# Patient Record
Sex: Male | Born: 1990
Health system: Southern US, Community
[De-identification: ages and names within clinical notes are randomized; demographics above are authoritative.]

## PROBLEM LIST (undated history)

## (undated) DIAGNOSIS — G43909 Migraine, unspecified, not intractable, without status migrainosus: Secondary | ICD-10-CM

## (undated) HISTORY — DX: Migraine, unspecified, not intractable, without status migrainosus: G43.909

---
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
## 2012-01-21 ENCOUNTER — Encounter: Payer: Self-pay | Admitting: Gastroenterology

## 2012-01-21 ENCOUNTER — Other Ambulatory Visit (INDEPENDENT_AMBULATORY_CARE_PROVIDER_SITE_OTHER): Payer: BC Managed Care – PPO

## 2012-01-21 ENCOUNTER — Ambulatory Visit (INDEPENDENT_AMBULATORY_CARE_PROVIDER_SITE_OTHER): Payer: BC Managed Care – PPO | Admitting: Gastroenterology

## 2012-01-21 VITALS — BP 112/60 | HR 72 | Ht 71.0 in | Wt 255.1 lb

## 2012-01-21 DIAGNOSIS — R197 Diarrhea, unspecified: Secondary | ICD-10-CM

## 2012-01-21 DIAGNOSIS — L259 Unspecified contact dermatitis, unspecified cause: Secondary | ICD-10-CM

## 2012-01-21 DIAGNOSIS — Z91018 Allergy to other foods: Secondary | ICD-10-CM

## 2012-01-21 DIAGNOSIS — L309 Dermatitis, unspecified: Secondary | ICD-10-CM

## 2012-01-21 DIAGNOSIS — T7840XA Allergy, unspecified, initial encounter: Secondary | ICD-10-CM

## 2012-01-21 LAB — CBC WITH DIFFERENTIAL/PLATELET
Basophils Relative: 0.2 % (ref 0.0–3.0)
Eosinophils Absolute: 0.2 10*3/uL (ref 0.0–0.7)
Eosinophils Relative: 3.2 % (ref 0.0–5.0)
Hemoglobin: 16.6 g/dL (ref 13.0–17.0)
Lymphocytes Relative: 28.5 % (ref 12.0–46.0)
MCHC: 34.4 g/dL (ref 30.0–36.0)
MCV: 86.7 fl (ref 78.0–100.0)
Neutro Abs: 4.8 10*3/uL (ref 1.4–7.7)
Neutrophils Relative %: 62.9 % (ref 43.0–77.0)
RBC: 5.57 Mil/uL (ref 4.22–5.81)
WBC: 7.6 10*3/uL (ref 4.5–10.5)

## 2012-01-21 LAB — COMPREHENSIVE METABOLIC PANEL
AST: 28 U/L (ref 0–37)
Albumin: 4.4 g/dL (ref 3.5–5.2)
Alkaline Phosphatase: 55 U/L (ref 39–117)
BUN: 12 mg/dL (ref 6–23)
Calcium: 9.6 mg/dL (ref 8.4–10.5)
Creatinine, Ser: 0.9 mg/dL (ref 0.4–1.5)
Glucose, Bld: 91 mg/dL (ref 70–99)

## 2012-01-21 LAB — C-REACTIVE PROTEIN: CRP: 1 mg/dL (ref 1–20)

## 2012-01-21 LAB — VITAMIN B12: Vitamin B-12: 482 pg/mL (ref 211–911)

## 2012-01-21 LAB — IBC PANEL
Iron: 66 ug/dL (ref 42–165)
Saturation Ratios: 14.3 % — ABNORMAL LOW (ref 20.0–50.0)
Transferrin: 329 mg/dL (ref 212.0–360.0)

## 2012-01-21 LAB — FOLATE: Folate: 17.5 ng/mL (ref >5.9)

## 2012-01-21 LAB — SEDIMENTATION RATE: Sed Rate: 4 mm/hr (ref 0–22)

## 2012-01-21 LAB — FERRITIN: Ferritin: 55.5 ng/mL (ref 22.0–322.0)

## 2012-01-21 MED ORDER — DICYCLOMINE HCL 10 MG PO CAPS: 10.0000 mg | ORAL_CAPSULE | Freq: Three times a day (TID) | ORAL | Status: DC

## 2012-01-21 NOTE — Patient Instructions (Addendum)
Your physician has requested that you go to the basement for the following lab work before leaving today: Stool for O&P, Stool for WBC, Fecal Fat, [facility] Panel, Anemia Profile, CRP, Sed rate, Celiac 10 panel. We have sent the following medications to your pharmacy for you to pick up at your convenience: Bentyl 10 mg-Take 1 tablet 3 times daily before meals. We have given you a "FODMAP" diet to read over. CC: Dr Lynnea Ferrier

## 2012-01-21 NOTE — Progress Notes (Signed)
History of Present Illness:  This is a 21 year old Caucasian male [facility]. He is with his mother today, and they complain of 5 years of postprandial urgency, diarrhea, occasional crampy pain and occasional nausea. The patient does not have nocturnal diarrhea or incontinency, melena or hematochezia. He does have mild lactose intolerance, but otherwise does not have any specific food intolerances. His appetite is good and his weight is stable. He denies dyspepsia, reflux symptoms, history of hepatitis, pancreatitis, or any hepatobiliary or general medical symptoms. He does have recurrent eczema and a severe nut allergy. He has not had previous x-rays, endoscopic exams or stool exams. There is no history of infectious disease exposure, foreign travel, antibiotic use, or familial diseases. His mother is a patient mine has IBS diarrhea.  I have reviewed this patient's present history, medical and surgical past history, allergies and medications.     ROS: The remainder of the 10 point ROS is negative... history of recurrent migraine headaches, but the patient denies regular use of NSAIDs or salicylates. He denies any specific neurological or psychiatric problems.     Physical Exam: Blood pressure 112/60, pulse 72 and regular, and weight 255 pounds the BMI of 35.58. I cannot appreciate stigmata of chronic liver disease. General well developed well nourished patient in no acute distress, appearing their stated age Eyes PERRLA, no icterus, fundoscopic exam per opthamologist Skin no lesions noted Neck supple, no adenopathy, no thyroid enlargement, no tenderness Chest clear to percussion and auscultation Heart no significant murmurs, gallops or rubs noted Abdomen no hepatosplenomegaly masses or tenderness, BS normal.  Rectal inspection normal no fissures, or fistulae noted.  No masses or tenderness on digital exam. Stool guaiac negative. Extremities no acute joint lesions, edema,  phlebitis or evidence of cellulitis. Neurologic patient oriented x 3, cranial nerves intact, no focal neurologic deficits noted. Psychological mental status normal and normal affect.  Assessment and plan: Symptoms consistent with rapid intestinal transient IBS. I have ordered stool exams for parasites, WBC, culture, and blood tests including celiac profile. Stool [place] fat stain also ordered. I have placed him on a FODMAP-IBS diet with dicyclomine 10 mg 30 minutes before meals 3 times a day. The patient has not tried Imodium in the past. He may need endoscopy with small bowel biopsy and colonoscopy depending on his workup and clinical course. Encounter Diagnosis  Name Primary?  . Diarrhea Yes

## 2012-01-22 ENCOUNTER — Other Ambulatory Visit: Payer: BC Managed Care – PPO

## 2012-01-22 DIAGNOSIS — R197 Diarrhea, unspecified: Secondary | ICD-10-CM

## 2012-01-22 LAB — CELIAC PANEL 10: Tissue Transglutaminase Ab, IgA: 2.4 U/mL (ref <20)

## 2012-01-24 LAB — OVA AND PARASITE SCREEN

## 2012-01-28 ENCOUNTER — Telehealth: Payer: Self-pay | Admitting: Gastroenterology

## 2012-01-28 NOTE — Telephone Encounter (Signed)
I need to call [facility] about the fecal fat qual. Test that was cancelled; all other tests were normal except iron saturation. Mom reports pt started the dicyclomine and gets nauseated when he takes that. His stools may be loose, but not really watery like diarrhea. Please advise. Thanks.

## 2012-01-29 NOTE — Telephone Encounter (Signed)
He has ibs...use prn imodium and FODMAP DIET

## 2012-01-29 NOTE — Telephone Encounter (Signed)
Do I need to pursue w/ [facility] the cancelled fecal fat qual. Test ? Thanks.

## 2012-01-29 NOTE — Telephone Encounter (Signed)
NO

## 2012-01-29 NOTE — Telephone Encounter (Signed)
lmom for pt to call back

## 2012-01-30 NOTE — Telephone Encounter (Signed)
Informed mom of Dr Norval Gable dx of IBS and his recommendation to follow the FODMAP diet and use Imodium PRN. Mom and I had a long conversation about the labs, OV, and pt's problems. I offered to ask Dr Jarold Motto if pt could be schedule for an ECL per his note, but mom declined. She states he already eats basically what's on the diet. I offered pt another appt to discuss this with Dr Jarold Motto and she declined stating she's just frustrated. Informed mom she may call back for questions and she stated understanding.

## 2012-11-27 ENCOUNTER — Encounter: Payer: Self-pay | Admitting: Family Medicine

## 2012-11-27 ENCOUNTER — Ambulatory Visit (INDEPENDENT_AMBULATORY_CARE_PROVIDER_SITE_OTHER): Payer: BC Managed Care – PPO | Admitting: Family Medicine

## 2012-11-27 VITALS — BP 110/72 | HR 78 | Temp 98.7°F | Resp 16 | Wt 244.0 lb

## 2012-11-27 DIAGNOSIS — G43909 Migraine, unspecified, not intractable, without status migrainosus: Secondary | ICD-10-CM

## 2012-11-27 MED ORDER — TOPIRAMATE 25 MG PO TABS: 50.0000 mg | ORAL_TABLET | Freq: Two times a day (BID) | ORAL | Status: DC

## 2012-11-27 NOTE — Progress Notes (Signed)
  Subjective:    Patient ID: Paul Guzman, male    DOB: [birth year], 22 y.o.   MRN: [id number]  HPI  Patient was started on Topamax 50 mg by mouth twice a day for migraine prophylaxis in December of 2013.  At that time he was having a daily headache and 3 for severe migraines per month.  The Topamax he no longer has a daily headache. He only has one migraine per month. He is doing well. He has lost almost 10 pounds since starting the Topamax.   He is still interested in possibly trying to wean down on the Topamax. Past Medical History  Diagnosis Date  . Migraines    No current outpatient prescriptions on file prior to visit.   No current facility-administered medications on file prior to visit.   No Known Allergies History   Social History  . Marital Status: Single    Spouse Name: N/A    Number of Children: 0  . Years of Education: N/A   Occupational History  . student    Social History Main Topics  . Smoking status: Never Smoker   . Smokeless tobacco: Never Used  . Alcohol Use: No  . Drug Use: No  . Sexually Active: Not on file   Other Topics Concern  . Not on file   Social History Narrative  . No narrative on file     Review of Systems  All other systems reviewed and are negative.       Objective:   Physical Exam  Vitals reviewed. Eyes: Conjunctivae are normal. Pupils are equal, round, and reactive to light.  Neck: Neck supple. No thyromegaly present.  Cardiovascular: Normal rate, regular rhythm and normal heart sounds.   Pulmonary/Chest: Effort normal and breath sounds normal. No respiratory distress. He has no wheezes. He has no rales.  Abdominal: Soft. Bowel sounds are normal. He exhibits no distension. There is no tenderness. There is no rebound.  Lymphadenopathy:    He has no cervical adenopathy.          Assessment & Plan:  1. Migraines Begin slowly weaning down on Topamax. Decrease the dose by 25 mg each month until either the patient is off  Topamax or  Until we reach the lowest effective dose. - COMPLETE METABOLIC PANEL WITH GFR - CBC with Differential

## 2012-11-28 ENCOUNTER — Encounter: Payer: Self-pay | Admitting: Family Medicine

## 2012-11-28 LAB — COMPLETE METABOLIC PANEL WITH GFR
AST: 25 U/L (ref 0–37)
Albumin: 4.8 g/dL (ref 3.5–5.2)
BUN: 13 mg/dL (ref 6–23)
CO2: 29 mEq/L (ref 19–32)
Calcium: 9.8 mg/dL (ref 8.4–10.5)
Chloride: 105 mEq/L (ref 96–112)
GFR, Est African American: >89 mL/min
Glucose, Bld: 93 mg/dL (ref 70–99)
Potassium: 4 mEq/L (ref 3.5–5.3)

## 2012-11-28 LAB — CBC WITH DIFFERENTIAL/PLATELET
Basophils Absolute: 0 10*3/uL (ref 0.0–0.1)
Eosinophils Absolute: 0.2 10*3/uL (ref 0.0–0.7)
Eosinophils Relative: 2 % (ref 0–5)
Lymphocytes Relative: 29 % (ref 12–46)
MCV: 82.7 fL (ref 78.0–100.0)
Neutrophils Relative %: 63 % (ref 43–77)
Platelets: 283 10*3/uL (ref 150–400)
RDW: 13.7 % (ref 11.5–15.5)
WBC: 8 10*3/uL (ref 4.0–10.5)

## 2012-12-23 ENCOUNTER — Other Ambulatory Visit: Payer: Self-pay | Admitting: Family Medicine

## 2012-12-24 ENCOUNTER — Telehealth: Payer: Self-pay | Admitting: Family Medicine

## 2012-12-24 MED ORDER — EPINEPHRINE 0.3 MG/0.3ML IJ SOAJ: 0.3000 mg | Freq: Once | INTRAMUSCULAR | Status: DC

## 2012-12-24 NOTE — Telephone Encounter (Signed)
Med ordered

## 2013-04-15 ENCOUNTER — Telehealth: Payer: Self-pay | Admitting: Family Medicine

## 2013-04-15 NOTE — Telephone Encounter (Signed)
Patient would like to have Dr.Pickard . Change his RX of Topamax back to 50 mg in the morning and 50 mg at night. [facility] [address] .

## 2013-04-16 ENCOUNTER — Other Ambulatory Visit: Payer: Self-pay | Admitting: Family Medicine

## 2013-04-16 MED ORDER — TOPIRAMATE 25 MG PO TABS: 50.0000 mg | ORAL_TABLET | Freq: Two times a day (BID) | ORAL | Status: DC

## 2013-04-16 NOTE — Telephone Encounter (Signed)
done

## 2013-06-19 ENCOUNTER — Other Ambulatory Visit: Payer: Self-pay | Admitting: Family Medicine

## 2013-06-19 NOTE — Telephone Encounter (Signed)
Ok to refill 

## 2013-06-22 NOTE — Telephone Encounter (Signed)
ok 

## 2013-10-29 ENCOUNTER — Ambulatory Visit (INDEPENDENT_AMBULATORY_CARE_PROVIDER_SITE_OTHER): Payer: BC Managed Care – PPO | Admitting: Family Medicine

## 2013-10-29 ENCOUNTER — Encounter: Payer: Self-pay | Admitting: Family Medicine

## 2013-10-29 VITALS — BP 122/74 | HR 100 | Temp 98.3°F | Resp 18 | Ht 70.5 in | Wt 258.0 lb

## 2013-10-29 DIAGNOSIS — J019 Acute sinusitis, unspecified: Secondary | ICD-10-CM

## 2013-10-29 MED ORDER — AMOXICILLIN 875 MG PO TABS: 875.0000 mg | ORAL_TABLET | Freq: Two times a day (BID) | ORAL | Status: DC

## 2013-10-29 NOTE — Progress Notes (Signed)
   Subjective:    Patient ID: Paul Guzman, male    DOB: 1990-10-30, 23 y.o.   MRN: [id number]  HPI Patient has been dealing with allergies for the last week. He's been taking Claritin to help treat the postnasal drip, drainage, rhinorrhea. However recently he developed pain in his left maxillary sinus area and a severe sore throat due to worsening drainage. He's also developed a fever to 102 and had. On examination today there is no erythema in the posterior oropharynx however the patient is extremely tender to palpation in his left maxillary sinus area. Past Medical History  Diagnosis Date  . Migraines    Current Outpatient Prescriptions on File Prior to Visit  Medication Sig Dispense Refill  . EPINEPHrine (AUVI-Q) 0.3 mg/0.3 mL SOAJ Inject 0.3 mLs (0.3 mg total) into the muscle once.  1 Device  1  . EPIPEN 2-PAK 0.3 MG/0.3ML SOAJ USE AS DIRECTED  1 Device  1  . loratadine-pseudoephedrine (CLARITIN-D 24-HOUR) 10-240 MG per 24 hr tablet Take 1 tablet by mouth daily.      . rizatriptan (MAXALT) 10 MG tablet TAKE 1 TABLET BY MOUTH AS NEEDED FOR MIGRANE  6 tablet  3  . topiramate (TOPAMAX) 25 MG tablet Take 2 tablets (50 mg total) by mouth 2 (two) times daily.  120 tablet  3   No current facility-administered medications on file prior to visit.   No Known Allergies History   Social History  . Marital Status: Single    Spouse Name: N/A    Number of Children: 0  . Years of Education: N/A   Occupational History  . student    Social History Main Topics  . Smoking status: Never Smoker   . Smokeless tobacco: Never Used  . Alcohol Use: No  . Drug Use: No  . Sexual Activity: Not on file   Other Topics Concern  . Not on file   Social History Narrative  . No narrative on file      Review of Systems  All other systems reviewed and are negative.      Objective:   Physical Exam  Vitals reviewed. Constitutional: He appears well-developed and well-nourished.  HENT:  Right  Ear: Tympanic membrane, external ear and ear canal normal.  Left Ear: Tympanic membrane, external ear and ear canal normal.  Nose: Mucosal edema and rhinorrhea present. Left sinus exhibits maxillary sinus tenderness.  Mouth/Throat: Oropharynx is clear and moist. No oropharyngeal exudate.  Eyes: Conjunctivae are normal.  Neck: Normal range of motion. Neck supple.  Cardiovascular: Normal rate, regular rhythm and normal heart sounds.   Pulmonary/Chest: Effort normal and breath sounds normal. No respiratory distress. He has no wheezes. He has no rales.  Lymphadenopathy:    He has no cervical adenopathy.          Assessment & Plan:  1. Acute rhinosinusitis I believe the patient is dealing with seasonal allergies and has subsequently developed a secondary bacterial sinusitis. Continue Claritin. Add nasal saline 4 times a day, and amoxicillin 875 mg by mouth twice a day for 10 days - amoxicillin (AMOXIL) 875 MG tablet; Take 1 tablet (875 mg total) by mouth 2 (two) times daily.  Dispense: 20 tablet; Refill: 0

## 2013-12-23 ENCOUNTER — Ambulatory Visit (INDEPENDENT_AMBULATORY_CARE_PROVIDER_SITE_OTHER): Payer: BC Managed Care – PPO | Admitting: Family Medicine

## 2013-12-23 ENCOUNTER — Encounter: Payer: Self-pay | Admitting: Family Medicine

## 2013-12-23 VITALS — BP 120/76 | HR 78 | Temp 97.9°F | Resp 16 | Ht 68.0 in | Wt 258.0 lb

## 2013-12-23 DIAGNOSIS — J0101 Acute recurrent maxillary sinusitis: Secondary | ICD-10-CM

## 2013-12-23 DIAGNOSIS — J0100 Acute maxillary sinusitis, unspecified: Secondary | ICD-10-CM

## 2013-12-23 DIAGNOSIS — Z9109 Other allergy status, other than to drugs and biological substances: Secondary | ICD-10-CM

## 2013-12-23 MED ORDER — METHYLPREDNISOLONE (PAK) 4 MG PO TABS: ORAL_TABLET | ORAL | Status: DC

## 2013-12-23 MED ORDER — AMOXICILLIN-POT CLAVULANATE 875-125 MG PO TABS: 1.0000 | ORAL_TABLET | Freq: Two times a day (BID) | ORAL | Status: DC

## 2013-12-23 NOTE — Patient Instructions (Signed)
Start antibiotics Steroids for inflammation Call if not improved

## 2013-12-23 NOTE — Progress Notes (Signed)
Patient ID: Paul GrieveJustin L Guzman, male   DOB: 10-09-1990, 23 y.o.   MRN: [id number]   Subjective:    Patient ID: Paul Guzman, male    DOB: 10-09-1990, 23 y.o.   MRN: [id number]  Patient presents for Illness  patient here with sinus pressure drainage minimal cough, dizzy spells headache for the past 4 days. He has history of allergies and daily congestion. He's had problems with reactive airway and upper respiratory throughout his childhood. His mother also suffers with sinus disease. He was treated for sinus infection back in May with amoxicillin he did also using nasal steroids which he does use on and off but does not get very much benefit from these. He did clear up after that visit but his symptoms have returned. He also had subjective fever and chills this weekend. He does note that the sinus pressure and headache itself as migraine again but this resolved with the use of his medication    Review Of Systems:  GEN- denies fatigue, fever, weight loss,weakness, recent illness HEENT- denies eye drainage, change in vision, nasal discharge, CVS- denies chest pain, palpitations RESP- denies SOB, cough, wheeze ABD- denies N/V, change in stools, abd pain Neuro- denies headache, dizziness, syncope, seizure activity       Objective:    BP 120/76  Pulse 78  Temp(Src) 97.9 F (36.6 C) (Oral)  Resp 16  Ht 5\' 8"  (1.727 m)  Wt 258 lb (117.028 kg)  BMI 39.24 kg/m2 GEN- NAD, alert and oriented x3 HEENT- PERRL, EOMI, non injected sclera, pink conjunctiva, MMM, oropharynx clear TM clear bilat no effusion, + maxillary sinus tenderness, inflammed turbinates,  Nasal drainage  Neck- Supple, no LAD CVS- RRR, no murmur RESP-CTAB Pulses- Radial 2+        Assessment & Plan:      Problem List Items Addressed This Visit   None    Visit Diagnoses   Acute recurrent maxillary sinusitis    -  Primary    He has daily congestion and nasal symptoms. He may benefit from ear nose and throat he wants to  hold off at this time and treat. I will put him on a Medrol Dose and Augmentin , 2nd illness in past 2 months and based on history    Relevant Medications       AMOXICILLIN-POT CLAVULANATE 875-125 MG PO TABS       Methylprednisolone (MEDROL PAK) 4 mg po tab    Environmental allergies        He's had allergy testing he has known environmental allergies especially to tree nuts which he has an EpiPen. He may benefit from ear nose and throat        Note: This dictation was prepared with Dragon dictation along with smaller phrase technology. Any transcriptional errors that result from this process are unintentional.

## 2013-12-25 ENCOUNTER — Other Ambulatory Visit: Payer: Self-pay | Admitting: *Deleted

## 2013-12-25 ENCOUNTER — Other Ambulatory Visit: Payer: Self-pay | Admitting: Family Medicine

## 2013-12-25 MED ORDER — TOPIRAMATE 25 MG PO TABS: 50.0000 mg | ORAL_TABLET | Freq: Two times a day (BID) | ORAL | Status: DC

## 2013-12-25 NOTE — Telephone Encounter (Signed)
Rx Refilled  

## 2013-12-25 NOTE — Telephone Encounter (Signed)
Refill appropriate and filled per protocol. 

## 2014-01-25 ENCOUNTER — Other Ambulatory Visit: Payer: Self-pay | Admitting: Family Medicine

## 2014-01-26 ENCOUNTER — Telehealth: Payer: Self-pay | Admitting: *Deleted

## 2014-01-26 NOTE — Telephone Encounter (Signed)
Received fax requesting PA on Auvi- Q.   PA submitted.

## 2014-01-28 ENCOUNTER — Other Ambulatory Visit: Payer: Self-pay | Admitting: *Deleted

## 2014-02-11 NOTE — Telephone Encounter (Signed)
Received PA determination.   PA approved 01/11/2014- 02/10/2015.  Case ID: [id number].

## 2014-04-15 ENCOUNTER — Telehealth: Payer: Self-pay | Admitting: Family Medicine

## 2014-04-15 MED ORDER — EPINEPHRINE 0.3 MG/0.3ML IJ SOAJ: 0.3000 mg | Freq: Once | INTRAMUSCULAR | Status: DC

## 2014-04-15 NOTE — Telephone Encounter (Signed)
Mom is calling to say that there is a recall at the time of the auvi q and would like to know until the recall clears up can we call him in an epipen  [phone]

## 2014-04-15 NOTE — Telephone Encounter (Signed)
Med sent to pharm and pt aware 

## 2014-07-17 ENCOUNTER — Other Ambulatory Visit: Payer: Self-pay | Admitting: Family Medicine

## 2015-12-04 ENCOUNTER — Other Ambulatory Visit: Payer: Self-pay | Admitting: Family Medicine

## 2015-12-06 NOTE — Telephone Encounter (Signed)
Refill appropriate and filled per protocol. 

## 2016-01-10 ENCOUNTER — Telehealth: Payer: Self-pay | Admitting: Family Medicine

## 2016-01-10 MED ORDER — EPINEPHRINE 0.3 MG/0.3ML IJ SOAJ: 0.3000 mg | Freq: Once | INTRAMUSCULAR | 1 refills | Status: DC

## 2016-01-10 NOTE — Telephone Encounter (Signed)
Patient would like a new [facility] [address] in [place] for his EPINEPHrine (EPIPEN 2-PAK) 0.3 mg/0.3 mL IJ SOAJ injection  He recently moved to [place].   CB# [phone]

## 2016-01-10 NOTE — Telephone Encounter (Signed)
Medication called/sent to requested pharmacy  

## 2016-07-06 ENCOUNTER — Encounter: Payer: Self-pay | Admitting: Family Medicine

## 2016-07-06 ENCOUNTER — Telehealth: Payer: Self-pay | Admitting: Family Medicine

## 2016-07-06 ENCOUNTER — Ambulatory Visit (INDEPENDENT_AMBULATORY_CARE_PROVIDER_SITE_OTHER): Payer: 59 | Admitting: Family Medicine

## 2016-07-06 VITALS — BP 118/70 | HR 92 | Temp 99.0°F | Resp 18 | Ht 68.0 in | Wt 279.0 lb

## 2016-07-06 DIAGNOSIS — J069 Acute upper respiratory infection, unspecified: Secondary | ICD-10-CM

## 2016-07-06 DIAGNOSIS — J0100 Acute maxillary sinusitis, unspecified: Secondary | ICD-10-CM

## 2016-07-06 MED ORDER — GUAIFENESIN-CODEINE 100-10 MG/5ML PO SOLN: 5.0000 mL | Freq: Four times a day (QID) | ORAL | 0 refills | Status: DC | PRN

## 2016-07-06 MED ORDER — AMOXICILLIN-POT CLAVULANATE 875-125 MG PO TABS: 1.0000 | ORAL_TABLET | Freq: Two times a day (BID) | ORAL | 0 refills | Status: DC

## 2016-07-06 NOTE — Patient Instructions (Addendum)
F/U as needed  Give work note for Today, can return Monday

## 2016-07-06 NOTE — Telephone Encounter (Signed)
Medication called to pharmacy. 

## 2016-07-06 NOTE — Progress Notes (Signed)
   Subjective:    Patient ID: Paul GrieveJustin L Guzman, male    DOB: Jun 05, 1991, 26 y.o.   MRN: [id number]  Patient presents for Illness (x1 week- sinus pressure, fever, HA, chest congestion)    Pt here with sinus pressure, drainage, fever, headache congestion for past week. HIstory of sinus infections in past and multiple URI as child. Last seen here 2.5 years ago. He's not had any issues for the past 1-2 years he typically just takes his Claritin-D and tries to keep his sinuses flushed out. His symptoms however persisted despite taking over-the-counter cough and cold medications he also started taking Advil. His fever did not start until yesterday. He has not had any difficulty breathing. He did have some dizziness last night with the congestion and the fever    Review Of Systems:  GEN- denies fatigue,+ fever, weight loss,weakness, recent illness HEENT- denies eye drainage, change in vision, +nasal discharge, CVS- denies chest pain, palpitations RESP- denies SOB,+ cough, wheeze ABD- denies N/V, change in stools, abd pain GU- denies dysuria, hematuria, dribbling, incontinence MSK- denies joint pain, muscle aches, injury Neuro- + headache, +dizziness, denies syncope, seizure activity       Objective:    BP 118/70 (BP Location: Left Arm, Patient Position: Sitting, Cuff Size: Large)   Pulse 92   Temp 99 F (37.2 C) (Oral)   Resp 18   Ht 5\' 8"  (1.727 m)   Wt 279 lb (126.6 kg)   SpO2 97%   BMI 42.42 kg/m  GEN- NAD, alert and oriented x3 HEENT- PERRL, EOMI, non injected sclera, pink conjunctiva, MMM, oropharynx mild injection, TM clear bilat no effusion,  + maxillary sinus tenderness, inflammed turbinates,  Nasal drainage  Neck- Supple,+ LAD CVS- RRR, no murmur RESP-CTAB EXT- No edema Pulses- Radial 2+        Assessment & Plan:      Problem List Items Addressed This Visit    None    Visit Diagnoses    Acute non-recurrent maxillary sinusitis    -  Primary   sinusitis with some  phargynitis, post nasal causing the cough mostly. Treat with augmentin, robitussin AC, fever reducer, advised to use his claritin D    Relevant Medications   amoxicillin-clavulanate (AUGMENTIN) 875-125 MG tablet   guaiFENesin-codeine 100-10 MG/5ML syrup   Acute URI          Note: This dictation was prepared with Dragon dictation along with smaller phrase technology. Any transcriptional errors that result from this process are unintentional.

## 2016-07-06 NOTE — Telephone Encounter (Signed)
Pharmacy unable to fill cough syrup, it is on back order. Will need to be sent to [facility] on [address] [phone].

## 2017-04-01 DIAGNOSIS — Z23 Encounter for immunization: Secondary | ICD-10-CM | POA: Diagnosis not present

## 2017-04-28 ENCOUNTER — Other Ambulatory Visit: Payer: Self-pay | Admitting: Family Medicine

## 2017-05-03 ENCOUNTER — Other Ambulatory Visit: Payer: Self-pay | Admitting: Family Medicine

## 2017-05-03 MED ORDER — EPINEPHRINE 0.3 MG/0.3ML IJ SOAJ: 0.3000 mg | Freq: Once | INTRAMUSCULAR | 1 refills | Status: AC

## 2017-07-08 ENCOUNTER — Encounter: Payer: Self-pay | Admitting: Family Medicine

## 2017-07-08 ENCOUNTER — Ambulatory Visit (INDEPENDENT_AMBULATORY_CARE_PROVIDER_SITE_OTHER): Payer: 59 | Admitting: Family Medicine

## 2017-07-08 VITALS — BP 136/88 | HR 106 | Temp 99.0°F | Resp 14 | Ht 70.5 in | Wt 292.0 lb

## 2017-07-08 DIAGNOSIS — J324 Chronic pansinusitis: Secondary | ICD-10-CM

## 2017-07-08 MED ORDER — LEVOCETIRIZINE DIHYDROCHLORIDE 5 MG PO TABS: 5.0000 mg | ORAL_TABLET | Freq: Every evening | ORAL | 5 refills | Status: DC

## 2017-07-08 MED ORDER — TRIAMCINOLONE ACETONIDE 55 MCG/ACT NA AERO: 2.0000 | INHALATION_SPRAY | Freq: Every day | NASAL | 12 refills | Status: DC

## 2017-07-08 MED ORDER — AZELASTINE HCL 0.1 % NA SOLN: 2.0000 | Freq: Two times a day (BID) | NASAL | 12 refills | Status: DC

## 2017-07-08 NOTE — Progress Notes (Signed)
   Subjective:    Patient ID: Paul Guzman, male    DOB: Jan 09, 1991, 27 y.o.   MRN: [id number]  HPI She has been suffering with sinuses off and on his entire life. He states that for the last 2 years there has been constant pressure in both maxillary and both frontal sinuses. Over the last 1-1/2 months, the symptoms of gotten substantially worse or he has daily headaches. He is tried intermittently Claritin, Flonase, Afrin, and Sudafed with minimal relief. He is never able to fully clear his sinus passages. He feels that he is unable to breathe through his nostrils. He is here today to try to address this ongoing chronic problem. He denies any fevers or chills. He does have a history of migraines but he states that these pressure-like headaches are different and constant and mild but always present Past Medical History:  Diagnosis Date  . Migraines     Allergies  Allergen Reactions  . Other     Tree Nuts    Social History   Socioeconomic History  . Marital status: Single    Spouse name: Not on file  . Number of children: 0  . Years of education: Not on file  . Highest education level: Not on file  Social Needs  . Financial resource strain: Not on file  . Food insecurity - worry: Not on file  . Food insecurity - inability: Not on file  . Transportation needs - medical: Not on file  . Transportation needs - non-medical: Not on file  Occupational History  . Occupation: Consulting civil engineerstudent  Tobacco Use  . Smoking status: Never Smoker  . Smokeless tobacco: Never Used  Substance and Sexual Activity  . Alcohol use: No  . Drug use: No  . Sexual activity: Not on file  Other Topics Concern  . Not on file  Social History Narrative  . Not on file      Review of Systems  All other systems reviewed and are negative.      Objective:   Physical Exam  Constitutional: He appears well-developed and well-nourished.  HENT:  Right Ear: Tympanic membrane, external ear and ear canal normal.    Left Ear: Tympanic membrane, external ear and ear canal normal.  Nose: Mucosal edema and rhinorrhea present. No nose lacerations, sinus tenderness, nasal deformity, septal deviation or nasal septal hematoma. No epistaxis.  No foreign bodies. Right sinus exhibits maxillary sinus tenderness and frontal sinus tenderness. Left sinus exhibits maxillary sinus tenderness and frontal sinus tenderness.  Mouth/Throat: Oropharynx is clear and moist. No oropharyngeal exudate.  Eyes: Conjunctivae are normal.  Neck: Normal range of motion. Neck supple.  Cardiovascular: Normal rate, regular rhythm and normal heart sounds.  Pulmonary/Chest: Effort normal and breath sounds normal. No respiratory distress. He has no wheezes. He has no rales.  Lymphadenopathy:    He has no cervical adenopathy.  Vitals reviewed.         Assessment & Plan:  Patient has chronic sinusitis. Begin by trying a combination of Nasacort 2 sprays each nostril daily, Astelin 2 sprays each nostril twice daily, and Xyzal 5 mg daily and recheck in 2 weeks. If patient is seeing no benefit, proceed with CT scan of the sinuses and switch to high-dose Augmentin with a prolonged steroid taper over a period of 2-3 weeks if chronic sinusitis is confirmed on the CT scan. If no benefit is seen from this, would consult ENT to discuss sinus surgery options

## 2017-09-10 ENCOUNTER — Telehealth: Payer: Self-pay | Admitting: Family Medicine

## 2017-09-10 NOTE — Telephone Encounter (Signed)
Patient called [facility] stating that the Xyzal makes him too drowsy and would like a different allergy medication called in to pharm if possible.?

## 2017-09-11 MED ORDER — LORATADINE 10 MG PO TABS: 10.0000 mg | ORAL_TABLET | Freq: Every day | ORAL | 3 refills | Status: DC

## 2017-09-11 NOTE — Telephone Encounter (Signed)
Med sent to pharm and pt aware 

## 2017-09-11 NOTE — Telephone Encounter (Signed)
There is not a less sedating option.  Claritin 10 mg a day is nonsedating.  If he cannot tolerate claritin, could add singulair 10 mg a day.

## 2018-03-07 ENCOUNTER — Encounter: Payer: Self-pay | Admitting: Family Medicine

## 2018-03-07 ENCOUNTER — Other Ambulatory Visit: Payer: Self-pay

## 2018-03-07 ENCOUNTER — Ambulatory Visit (INDEPENDENT_AMBULATORY_CARE_PROVIDER_SITE_OTHER): Payer: 59 | Admitting: Family Medicine

## 2018-03-07 VITALS — BP 126/70 | HR 88 | Temp 98.2°F | Resp 14 | Ht 70.5 in | Wt 298.0 lb

## 2018-03-07 DIAGNOSIS — J324 Chronic pansinusitis: Secondary | ICD-10-CM | POA: Diagnosis not present

## 2018-03-07 MED ORDER — AMOXICILLIN-POT CLAVULANATE 875-125 MG PO TABS: 1.0000 | ORAL_TABLET | Freq: Two times a day (BID) | ORAL | 0 refills | Status: DC

## 2018-03-07 MED ORDER — MONTELUKAST SODIUM 10 MG PO TABS: 10.0000 mg | ORAL_TABLET | Freq: Every day | ORAL | 3 refills | Status: DC

## 2018-03-07 MED ORDER — PREDNISONE 20 MG PO TABS: 40.0000 mg | ORAL_TABLET | Freq: Every day | ORAL | 0 refills | Status: AC

## 2018-03-07 NOTE — Progress Notes (Signed)
Patient ID: Paul Guzman, male    DOB: [birth year], 27 y.o.   MRN: [id number]  PCP: Donita Brooks, MD  Chief Complaint  Patient presents with  . Illness    x2 days- fatigue, allergy like sx, swollen lymphs in neck, HA    Subjective:   Paul Guzman is a 27 y.o. male, with pmhx of chronic sinusitis and allergies presents to clinic with CC of worsening URI sinus sx. Sinusitis  This is a recurrent problem. The current episode started in the past 7 days. The problem has been rapidly worsening since onset. Maximum temperature: subjective. The pain is severe. Associated symptoms include chills, congestion, coughing, headaches, a hoarse voice, sinus pressure, sneezing, a sore throat and swollen glands. Pertinent negatives include no diaphoresis, ear pain, neck pain or shortness of breath. Past treatments include oral decongestants and saline sprays (antihistamine, nasocort, claritin d). The treatment provided no relief.      Patient Active Problem List   Diagnosis Date Noted  . Migraines      Prior to Admission medications   Medication Sig Start Date End Date Taking? Authorizing Provider  azelastine (ASTELIN) 0.1 % nasal spray Place 2 sprays into both nostrils 2 (two) times daily. Use in each nostril as directed 07/08/17  Yes Pickard, Priscille Heidelberg, MD  loratadine (CLARITIN) 10 MG tablet Take 1 tablet (10 mg total) by mouth daily. 09/11/17  Yes Donita Brooks, MD  loratadine-pseudoephedrine (CLARITIN-D 24-HOUR) 10-240 MG per 24 hr tablet Take 1 tablet by mouth daily.   Yes [provider]  rizatriptan (MAXALT) 10 MG tablet TAKE 1 TABLET BY MOUTH AS NEEDED FOR MIGRAINES 04/29/17  Yes Donita Brooks, MD  triamcinolone (NASACORT) 55 MCG/ACT AERO nasal inhaler Place 2 sprays into the nose daily. 07/08/17  Yes Donita Brooks, MD     Allergies  Allergen Reactions  . Other     Tree Nuts      Family History  Problem Relation Age of Onset  . Breast cancer Mother   .  Diabetes Maternal Grandfather   . Heart disease Maternal Grandfather        CHF  . Lung cancer Paternal Grandmother      Social History   Socioeconomic History  . Marital status: Single    Spouse name: Not on file  . Number of children: 0  . Years of education: Not on file  . Highest education level: Not on file  Occupational History  . Occupation: Consulting civil engineer  Social Needs  . Financial resource strain: Not on file  . Food insecurity:    Worry: Not on file    Inability: Not on file  . Transportation needs:    Medical: Not on file    Non-medical: Not on file  Tobacco Use  . Smoking status: Never Smoker  . Smokeless tobacco: Never Used  Substance and Sexual Activity  . Alcohol use: No  . Drug use: No  . Sexual activity: Not on file  Lifestyle  . Physical activity:    Days per week: Not on file    Minutes per session: Not on file  . Stress: Not on file  Relationships  . Social connections:    Talks on phone: Not on file    Gets together: Not on file    Attends religious service: Not on file    Active member of club or organization: Not on file    Attends meetings of clubs or organizations: Not on  file    Relationship status: Not on file  . Intimate partner violence:    Fear of current or ex partner: Not on file    Emotionally abused: Not on file    Physically abused: Not on file    Forced sexual activity: Not on file  Other Topics Concern  . Not on file  Social History Narrative  . Not on file     Review of Systems  Constitutional: Positive for chills. Negative for diaphoresis.  HENT: Positive for congestion, hoarse voice, sinus pressure, sneezing and sore throat. Negative for ear pain.   Eyes: Negative.   Respiratory: Positive for cough. Negative for shortness of breath.   Cardiovascular: Negative.   Gastrointestinal: Negative.   Endocrine: Negative.   Genitourinary: Negative.   Musculoskeletal: Negative.  Negative for neck pain.  Skin: Negative.     Allergic/Immunologic: Negative.   Neurological: Positive for headaches.  Hematological: Negative.   Psychiatric/Behavioral: Negative.   All other systems reviewed and are negative.      Objective:    Vitals:   03/07/18 1010  BP: 126/70  Pulse: 88  Resp: 14  Temp: 98.2 F (36.8 C)  TempSrc: Oral  SpO2: 97%  Weight: 298 lb (135.2 kg)  Height: 5' 10.5" (1.791 m)      Physical Exam  Constitutional: He is oriented to person, place, and time. He appears well-developed and well-nourished.  Non-toxic appearance. He does not appear ill. No distress.  HENT:  Head: Normocephalic and atraumatic.  Right Ear: Tympanic membrane, external ear and ear canal normal.  Left Ear: Tympanic membrane, external ear and ear canal normal.  Nose: Mucosal edema and rhinorrhea present. Right sinus exhibits maxillary sinus tenderness and frontal sinus tenderness. Left sinus exhibits maxillary sinus tenderness and frontal sinus tenderness.  Mouth/Throat: Uvula is midline and mucous membranes are normal. Mucous membranes are not pale, not dry and not cyanotic. No trismus in the jaw. No uvula swelling. Posterior oropharyngeal erythema present. No oropharyngeal exudate, posterior oropharyngeal edema or tonsillar abscesses. Tonsils are 1+ on the right. Tonsils are 1+ on the left. No tonsillar exudate.  Eyes: Pupils are equal, round, and reactive to light. Conjunctivae, EOM and lids are normal.  Neck: Trachea normal, normal range of motion and phonation normal. Neck supple. No tracheal deviation present.  Cardiovascular: Regular rhythm, normal heart sounds and normal pulses. Exam reveals no gallop and no friction rub.  No murmur heard. Pulses:      Radial pulses are 2+ on the right side, and 2+ on the left side.       Posterior tibial pulses are 2+ on the right side, and 2+ on the left side.  Pulmonary/Chest: Effort normal and breath sounds normal. No stridor. No tachypnea. No respiratory distress. He has no  decreased breath sounds. He has no wheezes. He has no rhonchi. He has no rales. He exhibits no tenderness.  Abdominal: Soft. Normal appearance and bowel sounds are normal. He exhibits no distension. There is no tenderness. There is no rebound and no guarding.  Musculoskeletal: Normal range of motion. He exhibits no edema.  Lymphadenopathy:       Head (right side): No submental, no submandibular, no tonsillar, no preauricular, no posterior auricular and no occipital adenopathy present.       Head (left side): No submental, no submandibular, no tonsillar, no preauricular, no posterior auricular and no occipital adenopathy present.    He has no cervical adenopathy.  Neurological: He is alert and oriented to  person, place, and time. Gait normal.  Skin: Skin is warm, dry and intact. Capillary refill takes less than 2 seconds. No rash noted. He is not diaphoretic.  Psychiatric: He has a normal mood and affect. His speech is normal and behavior is normal.  Nursing note and vitals reviewed.         Assessment & Plan:      ICD-10-CM   1. Chronic pansinusitis J32.4 predniSONE (DELTASONE) 20 MG tablet    montelukast (SINGULAIR) 10 MG tablet    amoxicillin-clavulanate (AUGMENTIN) 875-125 MG tablet    Acute on chronic sinusitis, reports worsening pain, subjective fever yesterday, worsening sinus pain and pressure.  Also has some scratchy throat and feels it in his chest, he Is concerned about cervical lymphadenopathy that got better today and I do not feel any nodes at all, suspect they were reactive  Given his complicated history of chronic sinusitis do feel he would likely require antibiotics.  No recent antibiotic use.  We will try initially continuing his current maintenance and allergy meds -Claritin, Sudafed, nose sprays, encouraged to add to it frequent use of saline nasal spray, humidifier at night, add montelukast daily and a burst of prednisone.  If he does not improve with this in the next 2  to 3 days or if he has any acute worsening or true fever of 100.4 higher, he was instructed to stop Sudafed and start the antibiotic.  Will refer to ENT for his several years a history of dealing with this.   Danelle BerryLeisa Amrie Gurganus, PA-C 03/07/18 10:36 AM

## 2018-03-12 ENCOUNTER — Telehealth: Payer: Self-pay | Admitting: *Deleted

## 2018-03-12 NOTE — Telephone Encounter (Signed)
Cough is very normal following URI.  He had abx that would cover lungs if pneumonia was present, I suspect there would be no pneumonia.  He had steroids which would treat bronchitis.  He should finish his augmentin.  Still should not be taking sudafed while taking abx.  He can try mucinex and other over the counter cough medicines.    If he has pain in chest, new fever, SOB, wheeze then we would need to recheck him and reexamine lungs - previously his lungs were normal.    Can reassure that without this other concerning new sx listed, cough is common, usually can linger 2-3 weeks and be the last annoying sx to resolve with URI.

## 2018-03-12 NOTE — Telephone Encounter (Signed)
Received call from patient.   Reports that he was seen on 03/07/2018 for sinus issues. Reports that he has been taking the ABTx, but he now has chest congestion and nonproductive cough.   Inquired as to if he needs to change or add anything to ABTx.

## 2018-03-13 NOTE — Telephone Encounter (Signed)
Call placed to patient and patient made aware.  

## 2018-03-27 DIAGNOSIS — J3089 Other allergic rhinitis: Secondary | ICD-10-CM | POA: Diagnosis not present

## 2018-03-27 DIAGNOSIS — R51 Headache: Secondary | ICD-10-CM | POA: Diagnosis not present

## 2018-03-27 DIAGNOSIS — Z23 Encounter for immunization: Secondary | ICD-10-CM | POA: Diagnosis not present

## 2018-04-23 DIAGNOSIS — J342 Deviated nasal septum: Secondary | ICD-10-CM | POA: Diagnosis not present

## 2018-04-23 DIAGNOSIS — J324 Chronic pansinusitis: Secondary | ICD-10-CM | POA: Diagnosis not present

## 2018-04-23 DIAGNOSIS — J343 Hypertrophy of nasal turbinates: Secondary | ICD-10-CM | POA: Diagnosis not present

## 2018-04-23 DIAGNOSIS — J019 Acute sinusitis, unspecified: Secondary | ICD-10-CM | POA: Diagnosis not present

## 2018-07-25 DIAGNOSIS — J343 Hypertrophy of nasal turbinates: Secondary | ICD-10-CM | POA: Diagnosis not present

## 2018-07-25 DIAGNOSIS — J342 Deviated nasal septum: Secondary | ICD-10-CM | POA: Diagnosis not present

## 2018-08-11 DIAGNOSIS — J019 Acute sinusitis, unspecified: Secondary | ICD-10-CM | POA: Diagnosis not present

## 2018-09-15 ENCOUNTER — Emergency Department (HOSPITAL_COMMUNITY)
Admission: EM | Admit: 2018-09-15 | Discharge: 2018-09-15 | Disposition: A | Discharge status: Home / Self Care | Payer: 59 | Attending: Emergency Medicine | Admitting: Emergency Medicine

## 2018-09-15 ENCOUNTER — Ambulatory Visit (INDEPENDENT_AMBULATORY_CARE_PROVIDER_SITE_OTHER): Payer: 59 | Admitting: Family Medicine

## 2018-09-15 ENCOUNTER — Encounter: Payer: Self-pay | Admitting: Family Medicine

## 2018-09-15 ENCOUNTER — Encounter (HOSPITAL_COMMUNITY): Payer: Self-pay | Admitting: Emergency Medicine

## 2018-09-15 ENCOUNTER — Emergency Department (HOSPITAL_COMMUNITY): Payer: 59

## 2018-09-15 ENCOUNTER — Other Ambulatory Visit: Payer: Self-pay

## 2018-09-15 DIAGNOSIS — R6889 Other general symptoms and signs: Secondary | ICD-10-CM | POA: Diagnosis not present

## 2018-09-15 DIAGNOSIS — B9789 Other viral agents as the cause of diseases classified elsewhere: Secondary | ICD-10-CM | POA: Insufficient documentation

## 2018-09-15 DIAGNOSIS — R509 Fever, unspecified: Secondary | ICD-10-CM | POA: Diagnosis not present

## 2018-09-15 DIAGNOSIS — R0789 Other chest pain: Secondary | ICD-10-CM | POA: Diagnosis not present

## 2018-09-15 DIAGNOSIS — R059 Cough, unspecified: Secondary | ICD-10-CM

## 2018-09-15 DIAGNOSIS — Z20822 Contact with and (suspected) exposure to covid-19: Secondary | ICD-10-CM

## 2018-09-15 DIAGNOSIS — R05 Cough: Secondary | ICD-10-CM

## 2018-09-15 DIAGNOSIS — Z79899 Other long term (current) drug therapy: Secondary | ICD-10-CM | POA: Insufficient documentation

## 2018-09-15 DIAGNOSIS — J069 Acute upper respiratory infection, unspecified: Secondary | ICD-10-CM | POA: Diagnosis not present

## 2018-09-15 MED ORDER — ALBUTEROL SULFATE HFA 108 (90 BASE) MCG/ACT IN AERS: 1.0000 | INHALATION_SPRAY | Freq: Four times a day (QID) | RESPIRATORY_TRACT | 0 refills | Status: DC | PRN

## 2018-09-15 NOTE — ED Provider Notes (Signed)
[facility]-EMERGENCY DEPT Provider Note   CSN: [id number] Arrival date & time: 09/15/18  1214    History   Chief Complaint Chief Complaint  Patient presents with  . Fever  . Cough  . Nasal Congestion    HPI Paul Guzman is a 28 y.o. male is here for evaluation of cough.  Onset Thursday.  Described as dry, persistent and frequent.  Associated with headache, diffuse, constant mild chest tightness, fever, nasal congestion and sinus pressure.  He developed right sided chest wall pain with coughing.  Has been taken over-the-counter Tylenol, Sudafed as needed.  He checked his temperature and it was 101.4 orally at 11 AM.  No antipyretics PTA. He had a telemedicine encounter with physician who referred him to the ED for a chest x-ray to rule out pneumonia.  Patient has been working from home for the last 10 days and following a social distancing.  He lives with his wife who does not have any symptoms.  No recent travel.  No exposure to confirmed [place] presents.  He denies past medical history.  He denies neck pain or stiffness, nausea, vomiting, abdominal pain, chest pain or shortness of breath with exertion.     HPI  Past Medical History:  Diagnosis Date  . Migraines     Patient Active Problem List   Diagnosis Date Noted  . Migraines     History reviewed. No pertinent surgical history.      Home Medications    Prior to Admission medications   Medication Sig Start Date End Date Taking? Authorizing Provider  acetaminophen (TYLENOL) 325 MG tablet Take 650 mg by mouth daily as needed for moderate pain or fever.   Yes [provider]  azelastine (ASTELIN) 0.1 % nasal spray Place 2 sprays into both nostrils 2 (two) times daily. Use in each nostril as directed 07/08/17  Yes Pickard, Priscille Heidelberg, MD  loratadine (CLARITIN) 10 MG tablet Take 1 tablet (10 mg total) by mouth daily. 09/11/17  Yes Donita Brooks, MD  Pseudoephedrine HCl (SUDAFED PO) Take 2  tablets by mouth daily as needed (nasal congestion).   Yes [provider]  albuterol (PROVENTIL HFA;VENTOLIN HFA) 108 (90 Base) MCG/ACT inhaler Inhale 1-2 puffs into the lungs every 6 (six) hours as needed for wheezing or shortness of breath. 09/15/18   Liberty Handy, PA-C  rizatriptan (MAXALT) 10 MG tablet TAKE 1 TABLET BY MOUTH AS NEEDED FOR MIGRAINES Patient taking differently: Take 10 mg by mouth as needed for migraine.  04/29/17   Donita Brooks, MD    Family History Family History  Problem Relation Age of Onset  . Breast cancer Mother   . Diabetes Maternal Grandfather   . Heart disease Maternal Grandfather        CHF  . Lung cancer Paternal Grandmother     Social History Social History   Tobacco Use  . Smoking status: Never Smoker  . Smokeless tobacco: Never Used  Substance Use Topics  . Alcohol use: No  . Drug use: No     Allergies   Other   Review of Systems Review of Systems  Constitutional: Positive for fever.  HENT: Positive for congestion and sinus pressure.   Respiratory: Positive for cough and chest tightness.   Neurological: Positive for headaches.  All other systems reviewed and are negative.    Physical Exam Updated Vital Signs BP 131/72   Pulse 85   Temp 99.1 F (37.3 C) (Oral)  Resp 18   Ht 5\' 10"  (1.778 m)   Wt 125.6 kg   SpO2 99%   BMI 39.75 kg/m   Physical Exam Vitals signs and nursing note reviewed.  Constitutional:      General: He is not in acute distress.    Appearance: He is well-developed.     Comments: NAD.  HENT:     Head: Normocephalic and atraumatic.     Right Ear: External ear normal.     Left Ear: External ear normal.     Nose: Congestion present.     Comments: Mild nasal mucosal erythema.  No rhinorrhea.  No sinus tenderness.    Mouth/Throat:     Comments: Moist mucous membranes.  Oropharynx and tonsils normal. Eyes:     General: No scleral icterus.    Conjunctiva/sclera: Conjunctivae normal.   Neck:     Musculoskeletal: Normal range of motion and neck supple.     Comments: No cervical lymphadenopathy.  Full range of motion of the neck without pain or rigidity. Cardiovascular:     Rate and Rhythm: Normal rate and regular rhythm.     Heart sounds: Normal heart sounds. No murmur.     Comments: 1+ radial pulses bilaterally  Pulmonary:     Effort: Pulmonary effort is normal.     Breath sounds: Normal breath sounds. No wheezing.     Comments: Normal work of breathing.  Dry cough noted during exam.  Diminished lung sounds to bases of the lungs bilaterally, difficult exam due to body habitus.  Mild tenderness to right sternal border. Musculoskeletal: Normal range of motion.        General: No deformity.  Skin:    General: Skin is warm and dry.     Capillary Refill: Capillary refill takes less than 2 seconds.  Neurological:     Mental Status: He is alert and oriented to person, place, and time.  Psychiatric:        Behavior: Behavior normal.        Thought Content: Thought content normal.        Judgment: Judgment normal.      ED Treatments / Results  Labs (all labs ordered are listed, but only abnormal results are displayed) Labs Reviewed - No data to display  EKG None  Radiology Dg Chest Portable 1 View  Result Date: 09/15/2018 CLINICAL DATA:  28 year old male with fever, chest congestion and dry cough EXAM: PORTABLE CHEST 1 VIEW COMPARISON:  None. FINDINGS: The lungs are clear and negative for focal airspace consolidation, pulmonary edema or suspicious pulmonary nodule. No pleural effusion or pneumothorax. Cardiac and mediastinal contours are within normal limits. No acute fracture or lytic or blastic osseous lesions. The visualized upper abdominal bowel gas pattern is unremarkable. IMPRESSION: Negative chest x-ray. Electronically Signed   By: Malachy Moan M.D.   On: 09/15/2018 12:52    Procedures Procedures (including critical care time)  Medications Ordered in  ED Medications - No data to display   Initial Impression / Assessment and Plan / ED Course  I have reviewed the triage vital signs and the nursing notes.  Pertinent labs & imaging results that were available during my care of the patient were reviewed by me and considered in my medical decision making (see chart for details).  Clinical Course as of Sep 14 1509  Mon Sep 15, 2018  1249 [phone] cell    [CG]    Clinical Course User Index [CG] Liberty Handy, PA-C  ddx includes viral URI including influenza and COVID 19.  Less likely PNA given normal VS and lung exam.  CXR order per OSH physician recommendation.  1445: Chest x-ray is negative.  No known sick contacts.  No recent travel.  No exposure to confirmed COVID.  No fever, tachypnea, tachycardia, hypoxia.  No significant history of immunocompromise.  Given current pandemic, this could very well be COVID-19.  I see no indication for emergent lab work, imaging, admission.  Not considered high risk.  I explained MDM and indication for formal COVID testing reserved for high risk and admissions.  He verbalized understanding.  Will dc with self isolation, supportive care, strict return precautions. He is aware of s/s that would suggest worsening illness and would warrant ED visit. He was instructed to call PCP for any guidance as outpatient. He verbalized understanding and in agreement with this.   Final Clinical Impressions(s) / ED Diagnoses   Final diagnoses:  Viral URI with cough  Paul Guzman was evaluated in Emergency Department on 09/15/2018 for the symptoms described in the history of present illness. He was evaluated in the context of the global COVID-19 pandemic, which necessitated consideration that the patient might be at risk for infection with the SARS-CoV-2 virus that causes COVID-19. Institutional protocols and algorithms that pertain to the evaluation of patients at risk for COVID-19 are in a state of rapid change  based on information released by regulatory bodies including the CDC and federal and state organizations. These policies and algorithms were followed during the patient's care in the ED.  ED Discharge Orders         Ordered    albuterol (PROVENTIL HFA;VENTOLIN HFA) 108 (90 Base) MCG/ACT inhaler  Every 6 hours PRN     09/15/18 1335           Jerrell Mylar 09/15/18 1511    Tilden Fossa, MD 09/16/18 [phone]

## 2018-09-15 NOTE — ED Triage Notes (Signed)
Pt reports fevers, chest congestion, dry cough for several days. Denies being around anyone or any recent travel.

## 2018-09-15 NOTE — Progress Notes (Signed)
Virtual Visit via Telephone Note  I connected with Paul Guzman on 09/15/18 at 11:00am by telephone and verified that I am speaking with the correct person using two identifiers.   Pt location: at home  Physical location: Milinda Antis MD, in office, [facility]    I discussed the limitations, risks, security and privacy concerns of performing an evaluation and management service by telephone and the availability of in person appointments. I also discussed with the patient that there may be a patient responsible charge related to this service. The patient expressed understanding and agreed to proceed.   History of Present Illness:    Since last Thursday has had fever 101-102F , chest congestion, pain in chest, has coughing fits every 15 minutes, mostly dry. Headache with the fever. Nasal congestion, pressure   Taking tylenol , sudafed This AM 101.71F  No travel, no known exposure Working from home for past 10 days  No vomiting or diarrhea  Bad allergies   Dr. Jearld Fenton did septoplasty in Feb.      Observations/Objective:  No audible difficulty breathing, or wheeze, speaking in full sentences  Assessment and Plan:  COVID Suspect with Viral symptoms, but fever for past 4-5 days with chest pain now, concern for possible PNA, pt to get CXR, [facility] would not take him so he was routed to nearest hospital for triage  Follow Up Instructions:     I discussed the assessment and treatment plan with the patient. The patient was provided an opportunity to ask questions and all were answered. The patient agreed with the plan and demonstrated an understanding of the instructions.   The patient was advised to call back or seek an in-person evaluation if the symptoms worsen or if the condition fails to improve as anticipated.  I provided minutes of non-face-to-face time during this encounter. 11: 13 am  Milinda Antis, MD

## 2018-09-15 NOTE — Discharge Instructions (Addendum)
You were seen in the ED for cough, chest tightness, fever, headache.  Chest x-ray today was normal.  Given current outbreak, we will manage symptoms under presumed coronavirus infection.  There was no medical necessity for formal testing.  Testing would not have changed our management.  It is also possible you could have other viral upper respiratory infection.  Management will include self-isolation, monitoring of symptoms and supportive care with over-the-counter medicines.  Return to the ED if there is increased work of breathing, shortness of breath, inability to tolerate fluids, weakness, chest pain.  The Department of Public Health is recommending the following isolation recommendations.  Use attached form to track your symptoms and number of days. All 3 of the following must be met:  At least 7 days since symptom onset 72 hours of absence of fever without antifever medicine (ibuprofen, acetaminophen) Improvement of respiratory symptoms   Stay well-hydrated. Rest. You can use over the counter medications to help with symptoms: 600 mg ibuprofen (motrin, aleve, advil) or acetaminophen (tylenol) every 6 hours, around the clock to help with associated fevers, sore throat, headaches, generalized body aches and malaise.  Oxymetazoline (afrin) intranasal spray once daily for no more than 3 days to help with congestion, after 3 days you can switch to another over-the-counter nasal steroid spray such as fluticasone (flonase) Allergy medication (loratadine, cetirizine, etc) and phenylephrine (sudafed) help with nasal congestion, runny nose and postnasal drip.   Dextromethorphan (Delsym) to suppress dry cough. Frequent coughing is likely causing your chest wall pain Wash your hands often to prevent spread.  Albuterol inhaler will help with associated chest tightness and wheezing.      Person Under Monitoring Name: Paul Guzman [facility]  Location: [place]  [address]   Infection Prevention Recommendations for Individuals Confirmed to have, or Being Evaluated for, 2019 Novel Coronavirus (COVID-19) Infection Who Receive Care at Home  Individuals who are confirmed to have, or are being evaluated for, COVID-19 should follow the prevention steps below until a healthcare provider or local or state health department says they can return to normal activities.  Stay home except to get medical care You should restrict activities outside your home, except for getting medical care. Do not go to work, school, or public areas, and do not use public transportation or taxis.  Call ahead before visiting your doctor Before your medical appointment, call the healthcare provider and tell them that you have, or are being evaluated for, COVID-19 infection. This will help the healthcare providers office take steps to keep other people from getting infected. Ask your healthcare provider to call the local or state health department.  Monitor your symptoms Seek prompt medical attention if your illness is worsening (e.g., difficulty breathing). Before going to your medical appointment, call the healthcare provider and tell them that you have, or are being evaluated for, COVID-19 infection. Ask your healthcare provider to call the local or state health department.  Wear a facemask You should wear a facemask that covers your nose and mouth when you are in the same room with other people and when you visit a healthcare provider. People who live with or visit you should also wear a facemask while they are in the same room with you.  Separate yourself from other people in your home As much as possible, you should stay in a different room from other people in your home. Also, you should use a separate bathroom, if available.  Avoid sharing household  items You should not share dishes, drinking glasses, cups, eating utensils, towels, bedding, or other items with other people in  your home. After using these items, you should wash them thoroughly with soap and water.  Cover your coughs and sneezes Cover your mouth and nose with a tissue when you cough or sneeze, or you can cough or sneeze into your sleeve. Throw used tissues in a lined trash can, and immediately wash your hands with soap and water for at least 20 seconds or use an alcohol-based hand rub.  Wash your [facility] your hands often and thoroughly with soap and water for at least 20 seconds. You can use an alcohol-based hand sanitizer if soap and water are not available and if your hands are not visibly dirty. Avoid touching your eyes, nose, and mouth with unwashed hands.   Prevention Steps for Caregivers and Household Members of Individuals Confirmed to have, or Being Evaluated for, COVID-19 Infection Being Cared for in the Home  If you live with, or provide care at home for, a person confirmed to have, or being evaluated for, COVID-19 infection please follow these guidelines to prevent infection:  Follow healthcare providers instructions Make sure that you understand and can help the patient follow any healthcare provider instructions for all care.  Provide for the patients basic needs You should help the patient with basic needs in the home and provide support for getting groceries, prescriptions, and other personal needs.  Monitor the patients symptoms If they are getting sicker, call his or her medical provider and tell them that the patient has, or is being evaluated for, COVID-19 infection. This will help the healthcare providers office take steps to keep other people from getting infected. Ask the healthcare provider to call the local or state health department.  Limit the number of people who have contact with the patient If possible, have only one caregiver for the patient. Other household members should stay in another home or place of residence. If this is not possible, they should stay in  another room, or be separated from the patient as much as possible. Use a separate bathroom, if available. Restrict visitors who do not have an essential need to be in the home.  Keep older adults, very young children, and other sick people away from the patient Keep older adults, very young children, and those who have compromised immune systems or chronic health conditions away from the patient. This includes people with chronic heart, lung, or kidney conditions, diabetes, and cancer.  Ensure good ventilation Make sure that shared spaces in the home have good air flow, such as from an air conditioner or an opened window, weather permitting.  Wash your hands often Wash your hands often and thoroughly with soap and water for at least 20 seconds. You can use an alcohol based hand sanitizer if soap and water are not available and if your hands are not visibly dirty. Avoid touching your eyes, nose, and mouth with unwashed hands. Use disposable paper towels to dry your hands. If not available, use dedicated cloth towels and replace them when they become wet.  Wear a facemask and gloves Wear a disposable facemask at all times in the room and gloves when you touch or have contact with the patients blood, body fluids, and/or secretions or excretions, such as sweat, saliva, sputum, nasal mucus, vomit, urine, or feces.  Ensure the mask fits over your nose and mouth tightly, and do not touch it during use. Throw out  disposable facemasks and gloves after using them. Do not reuse. Wash your hands immediately after removing your facemask and gloves. If your personal clothing becomes contaminated, carefully remove clothing and launder. Wash your hands after handling contaminated clothing. Place all used disposable facemasks, gloves, and other waste in a lined container before disposing them with other household waste. Remove gloves and wash your hands immediately after handling these items.  Do not share  dishes, glasses, or other household items with the patient Avoid sharing household items. You should not share dishes, drinking glasses, cups, eating utensils, towels, bedding, or other items with a patient who is confirmed to have, or being evaluated for, COVID-19 infection. After the person uses these items, you should wash them thoroughly with soap and water.  Wash laundry thoroughly Immediately remove and wash clothes or bedding that have blood, body fluids, and/or secretions or excretions, such as sweat, saliva, sputum, nasal mucus, vomit, urine, or feces, on them. Wear gloves when handling laundry from the patient. Read and follow directions on labels of laundry or clothing items and detergent. In general, wash and dry with the warmest temperatures recommended on the label.  Clean all areas the individual has used often Clean all touchable surfaces, such as counters, tabletops, doorknobs, bathroom fixtures, toilets, phones, keyboards, tablets, and bedside tables, every day. Also, clean any surfaces that may have blood, body fluids, and/or secretions or excretions on them. Wear gloves when cleaning surfaces the patient has come in contact with. Use a diluted bleach solution (e.g., dilute bleach with 1 part bleach and 10 parts water) or a household disinfectant with a label that says EPA-registered for coronaviruses. To make a bleach solution at home, add 1 tablespoon of bleach to 1 quart (4 cups) of water. For a larger supply, add  cup of bleach to 1 gallon (16 cups) of water. Read labels of cleaning products and follow recommendations provided on product labels. Labels contain instructions for safe and effective use of the cleaning product including precautions you should take when applying the product, such as wearing gloves or eye protection and making sure you have good ventilation during use of the product. Remove gloves and wash hands immediately after cleaning.  Monitor yourself for  signs and symptoms of illness Caregivers and household members are considered close contacts, should monitor their health, and will be asked to limit movement outside of the home to the extent possible. Follow the monitoring steps for close contacts listed on the symptom monitoring form.  ? If you have additional questions, contact your local health department or call the epidemiologist on call at [phone] (available 24/7). ? This guidance is subject to change. For the most up-to-date guidance from [facility], please refer to their website: YouBlogs.pl

## 2018-09-16 ENCOUNTER — Telehealth: Payer: Self-pay | Admitting: *Deleted

## 2018-09-16 NOTE — Telephone Encounter (Signed)
-----   Message from Salley Scarlet, MD sent at 09/15/2018  3:42 PM EDT ----- Regarding: F/U ER Visit  Please document in phone note  CXR was negative, he can use OTC delsym or cough med  Recommend he stay quarantined for 14 days  Okay to use albuterol inhaler given by ER   Take vitamin C/ZINC  Only go back to ER if symptoms worsen with breathing

## 2018-09-16 NOTE — Telephone Encounter (Signed)
Call placed to patient. LMTRC.  

## 2018-09-17 NOTE — Telephone Encounter (Signed)
Call placed to patient and patient made aware.  

## 2018-09-25 ENCOUNTER — Telehealth: Payer: Self-pay | Admitting: *Deleted

## 2018-09-25 NOTE — Telephone Encounter (Signed)
Received VM from patient [phone]~ telephone.   Patient reports that he was seen on 09/15/2018 for suspected COVID infection. States that he declined work note at that time, but does require it now.   Requesting note for self-quarantine from 09/15/2018- 09/19/2018.  Please advise.

## 2018-09-29 ENCOUNTER — Encounter: Payer: Self-pay | Admitting: Family Medicine

## 2018-09-29 NOTE — Telephone Encounter (Signed)
Note written

## 2018-09-29 NOTE — Progress Notes (Signed)
Work note given

## 2018-09-30 NOTE — Telephone Encounter (Signed)
Call placed to patient and patient made aware.   Requested letter to be scanned and e-mailed to [email or web address].   Letter sent.

## 2018-11-19 ENCOUNTER — Other Ambulatory Visit: Payer: Self-pay | Admitting: Family Medicine

## 2019-02-17 ENCOUNTER — Other Ambulatory Visit: Payer: Self-pay | Admitting: Family Medicine

## 2019-03-18 ENCOUNTER — Other Ambulatory Visit: Payer: Self-pay | Admitting: Family Medicine

## 2019-04-03 ENCOUNTER — Other Ambulatory Visit: Payer: Self-pay | Admitting: Family Medicine

## 2019-04-13 ENCOUNTER — Other Ambulatory Visit: Payer: Self-pay | Admitting: Family Medicine

## 2019-10-11 ENCOUNTER — Other Ambulatory Visit: Payer: Self-pay

## 2019-10-11 ENCOUNTER — Emergency Department (HOSPITAL_COMMUNITY)
Admission: EM | Admit: 2019-10-11 | Discharge: 2019-10-11 | Disposition: A | Discharge status: Home / Self Care | Payer: Commercial Managed Care - PPO | Attending: Emergency Medicine | Admitting: Emergency Medicine

## 2019-10-11 ENCOUNTER — Encounter (HOSPITAL_COMMUNITY): Payer: Self-pay | Admitting: Emergency Medicine

## 2019-10-11 ENCOUNTER — Emergency Department (HOSPITAL_COMMUNITY): Payer: Commercial Managed Care - PPO

## 2019-10-11 DIAGNOSIS — S91209A Unspecified open wound of unspecified toe(s) with damage to nail, initial encounter: Secondary | ICD-10-CM | POA: Diagnosis not present

## 2019-10-11 DIAGNOSIS — Z23 Encounter for immunization: Secondary | ICD-10-CM | POA: Diagnosis not present

## 2019-10-11 DIAGNOSIS — S99921A Unspecified injury of right foot, initial encounter: Secondary | ICD-10-CM | POA: Diagnosis present

## 2019-10-11 DIAGNOSIS — W28XXXA Contact with powered lawn mower, initial encounter: Secondary | ICD-10-CM | POA: Insufficient documentation

## 2019-10-11 DIAGNOSIS — S92424B Nondisplaced fracture of distal phalanx of right great toe, initial encounter for open fracture: Secondary | ICD-10-CM

## 2019-10-11 DIAGNOSIS — Y999 Unspecified external cause status: Secondary | ICD-10-CM | POA: Diagnosis not present

## 2019-10-11 DIAGNOSIS — Y92017 Garden or yard in single-family (private) house as the place of occurrence of the external cause: Secondary | ICD-10-CM | POA: Diagnosis not present

## 2019-10-11 DIAGNOSIS — Y93H2 Activity, gardening and landscaping: Secondary | ICD-10-CM | POA: Diagnosis not present

## 2019-10-11 MED ORDER — CEFAZOLIN SODIUM 1 G IJ SOLR
1.0000 g | Freq: Once | INTRAMUSCULAR | Status: AC
  Administered 2019-10-11: 1 g via INTRAMUSCULAR
  Filled 2019-10-11: qty 10

## 2019-10-11 MED ORDER — CEPHALEXIN 500 MG PO CAPS: ORAL_CAPSULE | ORAL | 0 refills | Status: DC

## 2019-10-11 MED ORDER — HYDROCODONE-ACETAMINOPHEN 5-325 MG PO TABS: 1.0000 | ORAL_TABLET | Freq: Four times a day (QID) | ORAL | 0 refills | Status: DC | PRN

## 2019-10-11 MED ORDER — TETANUS-DIPHTH-ACELL PERTUSSIS 5-2.5-18.5 LF-MCG/0.5 IM SUSP
0.5000 mL | Freq: Once | INTRAMUSCULAR | Status: AC
  Administered 2019-10-11: 0.5 mL via INTRAMUSCULAR
  Filled 2019-10-11: qty 0.5

## 2019-10-11 MED ORDER — LIDOCAINE HCL 2 % IJ SOLN
10.0000 mL | Freq: Once | INTRAMUSCULAR | Status: AC
  Administered 2019-10-11: 200 mg via INTRADERMAL
  Filled 2019-10-11: qty 20

## 2019-10-11 MED ORDER — OXYCODONE-ACETAMINOPHEN 5-325 MG PO TABS
1.0000 | ORAL_TABLET | Freq: Once | ORAL | Status: AC
  Administered 2019-10-11: 1 via ORAL
  Filled 2019-10-11: qty 1

## 2019-10-11 NOTE — ED Notes (Signed)
Provider at bedside

## 2019-10-11 NOTE — Discharge Instructions (Signed)
You have been evaluated for your toe injury.  You have injury to your nailbed as well as evidence of broken bone at the tip of your toe.  Please take antibiotic as prescribed.  Monitor wound for any signs of infection.  Cleans it twice daily and applying Neosporin cream over to provide protection.  Wear postop shoe for comfort.  Take antibiotic as needed but be aware that it can cause drowsiness.

## 2019-10-11 NOTE — ED Notes (Signed)
Pt states that he slipped while mowing the lawn and his right foot slipped underneath the mower. EMS applied a bandage and more bandages were applied in the ED waiting room d/t active bleeding.

## 2019-10-11 NOTE — ED Notes (Signed)
Pt foot

## 2019-10-11 NOTE — ED Triage Notes (Signed)
Pt to triage via [facility].  Pt fell on a hill while push mowing and R leg went over push mower.  Pt has partial avulsion to R great toe with nail removed. Bandage in place on arrival to ED.

## 2019-10-11 NOTE — ED Provider Notes (Signed)
[name] Provider Note   CSN: [id number] Arrival date & time: 10/11/19  1800     History Chief Complaint  Patient presents with  . Toe Injury    Paul Guzman is a 29 y.o. male.  The history is provided by the patient. No language interpreter was used.     29 year old male presented to ED via EMS for evaluation of foot injury.  Patient report this afternoon he was moving push mowing his yard down a ditch when he slipped and fell and his right foot went underneath the mower and the mower blade struck his right great toe through his shoes. He report acute onset of sharp pain which is been waxing waning and currently rated as 8 out of 10.  He was wearing his shoe at that time.  He is unable to recall his last tetanus status.  Pain is nonradiating.  No specific treatment tried.  Past Medical History:  Diagnosis Date  . Migraines     Patient Active Problem List   Diagnosis Date Noted  . Migraines     History reviewed. No pertinent surgical history.     Family History  Problem Relation Age of Onset  . Breast cancer Mother   . Diabetes Maternal Grandfather   . Heart disease Maternal Grandfather        CHF  . Lung cancer Paternal Grandmother     Social History   Tobacco Use  . Smoking status: Never Smoker  . Smokeless tobacco: Never Used  Substance Use Topics  . Alcohol use: No  . Drug use: No    Home Medications Prior to Admission medications   Medication Sig Start Date End Date Taking? Authorizing Provider  acetaminophen (TYLENOL) 325 MG tablet Take 650 mg by mouth daily as needed for moderate pain or fever.    [provider]  albuterol (PROVENTIL HFA;VENTOLIN HFA) 108 (90 Base) MCG/ACT inhaler Inhale 1-2 puffs into the lungs every 6 (six) hours as needed for wheezing or shortness of breath. 09/15/18   Kinnie Feil, PA-C  azelastine (ASTELIN) 0.1 % nasal spray PLACE 2 SPRAYS INTO BOTH NOSTRILS 2 (TWO) TIMES  DAILY. USE IN EACH NOSTRIL AS DIRECTED 04/13/19   Susy Frizzle, MD  loratadine (CLARITIN) 10 MG tablet TAKE 1 TABLET BY MOUTH EVERY DAY 11/19/18   Susy Frizzle, MD  Pseudoephedrine HCl (SUDAFED PO) Take 2 tablets by mouth daily as needed (nasal congestion).    [provider]  rizatriptan (MAXALT) 10 MG tablet TAKE 1 TABLET BY MOUTH AS NEEDED FOR MIGRAINES Patient taking differently: Take 10 mg by mouth as needed for migraine.  04/29/17   Susy Frizzle, MD    Allergies    Other  Review of Systems   Review of Systems  Constitutional: Negative for fever.  Skin: Positive for wound.  Neurological: Negative for numbness.    Physical Exam Updated Vital Signs BP 125/62 (BP Location: Left Arm)   Pulse 93   Temp 99.1 F (37.3 C) (Oral)   Resp 18   Ht 5\' 10"  (1.778 m)   Wt 124.7 kg   SpO2 98%   BMI 39.46 kg/m   Physical Exam Vitals and nursing note reviewed.  Constitutional:      General: He is not in acute distress.    Appearance: He is well-developed.  HENT:     Head: Atraumatic.  Eyes:     Conjunctiva/sclera: Conjunctivae normal.  Musculoskeletal:  General: Signs of injury (Right great toe: Obvious open deformity noted to the dorsum of the distal phalanx at the nail with complete obliteration of the nailbed.  Ecchymosis noted throughout with intact distal sensation and brisk cap refill.  No obvious joint involvement.) present.     Cervical back: Neck supple.  Skin:    Findings: No rash.  Neurological:     Mental Status: He is alert.     ED Results / Procedures / Treatments   Labs (all labs ordered are listed, but only abnormal results are displayed) Labs Reviewed - No data to display  EKG None  Radiology DG Foot Complete Right  Result Date: 10/11/2019 CLINICAL DATA:  Lawnmower accident to great toe.  Initial encounter. EXAM: RIGHT FOOT COMPLETE - 3+ VIEW COMPARISON:  None. FINDINGS: A mildly comminuted fracture of the great toe distal  phalanx is noted without significant displacement. No radiopaque foreign bodies are identified. No other fracture, subluxation or dislocation present. Soft tissue swelling is noted. IMPRESSION: Mildly comminuted great toe distal phalanx fracture without significant displacement. Electronically Signed   By: Harmon Pier M.D.   On: 10/11/2019 18:59    Procedures .Marland KitchenLaceration Repair  Date/Time: 10/11/2019 11:18 PM Performed by: Fayrene Helper, PA-C Authorized by: Fayrene Helper, PA-C   Consent:    Consent obtained:  Verbal   Consent given by:  Patient   Risks discussed:  Infection, need for additional repair, pain, poor cosmetic result and poor wound healing   Alternatives discussed:  No treatment and delayed treatment Universal protocol:    Procedure explained and questions answered to patient or proxy's satisfaction: yes     Relevant documents present and verified: yes     Test results available and properly labeled: yes     Imaging studies available: yes     Required blood products, implants, devices, and special equipment available: yes     Site/side marked: yes     Immediately prior to procedure, a time out was called: yes     Patient identity confirmed:  Verbally with patient Anesthesia (see MAR for exact dosages):    Anesthesia method:  Local infiltration   Local anesthetic:  Lidocaine 1% w/o epi Laceration details:    Location:  Toe   Toe location:  R big toe   Length (cm):  3   Depth (mm):  4 Repair type:    Repair type:  Complex Pre-procedure details:    Preparation:  Patient was prepped and draped in usual sterile fashion and imaging obtained to evaluate for foreign bodies Exploration:    Limited defect created (wound extended): no     Hemostasis achieved with:  Direct pressure and tied off vessels   Wound exploration: entire depth of wound probed and visualized     Wound extent: muscle damage and underlying fracture     Wound extent: no foreign bodies/material noted and no  nerve damage noted     Contaminated: no   Treatment:    Area cleansed with:  Saline and Betadine   Amount of cleaning:  Standard   Irrigation solution:  Sterile saline   Debridement:  Minimal   Undermining:  Minimal   Scar revision: no   Skin repair:    Repair method:  Sutures   Suture size:  4-0   Suture material:  Chromic gut   Suture technique:  Simple interrupted   Number of sutures:  2 Approximation:    Approximation:  Loose Post-procedure details:    Dressing:  Sterile dressing and antibiotic ointment   Patient tolerance of procedure:  Tolerated well, no immediate complications   (including critical care time)  Medications Ordered in ED Medications  ceFAZolin (ANCEF) injection 1 g (has no administration in time range)  Tdap (BOOSTRIX) injection 0.5 mL (0.5 mLs Intramuscular Given 10/11/19 2316)  oxyCODONE-acetaminophen (PERCOCET/ROXICET) 5-325 MG per tablet 1 tablet (1 tablet Oral Given 10/11/19 2250)  lidocaine (XYLOCAINE) 2 % (with pres) injection 200 mg (200 mg Intradermal Given by Other 10/11/19 2319)    ED Course  I have reviewed the triage vital signs and the nursing notes.  Pertinent labs & imaging results that were available during my care of the patient were reviewed by me and considered in my medical decision making (see chart for details).    MDM Rules/Calculators/A&P                      BP 125/62 (BP Location: Left Arm)   Pulse 93   Temp 99.1 F (37.3 C) (Oral)   Resp 18   Ht 5\' 10"  (1.778 m)   Wt 124.7 kg   SpO2 98%   BMI 39.46 kg/m   Final Clinical Impression(s) / ED Diagnoses Final diagnoses:  Open nondisplaced fracture of distal phalanx of right great toe, initial encounter  Avulsion of toenail, initial encounter    Rx / DC Orders ED Discharge Orders         Ordered    HYDROcodone-acetaminophen (NORCO/VICODIN) 5-325 MG tablet  Every 6 hours PRN     10/11/19 2324    cephALEXin (KEFLEX) 500 MG capsule     10/11/19 2324          11:20 PM patient injured right great toe when his toenail slipped under a push mower.  Laceration involving the dorsum of the distal phalanx with nail involvement.  Near total avulsion of the nail with nailbed exposed.  Wound was cleaned by me after digital block was performed.  Suture apply for hemostasis.  Dressing applied, postop shoe provided.  Patient given antibiotic and pain medication as well as updating his tetanus.  Appropriate wound care discussed.  Orthopedic referral given as needed.  Return precaution discussed.  His x-ray did demonstrate mild comminuted great toe distal phalanx fracture without significant displacement and no joint involvement.   10/13/19, PA-C 10/11/19 2326    10/13/19, DO 10/11/19 2339

## 2020-02-02 ENCOUNTER — Telehealth: Payer: 59 | Admitting: Nurse Practitioner

## 2020-02-25 ENCOUNTER — Ambulatory Visit (INDEPENDENT_AMBULATORY_CARE_PROVIDER_SITE_OTHER): Payer: BC Managed Care – PPO | Admitting: Podiatry

## 2020-02-25 ENCOUNTER — Other Ambulatory Visit: Payer: Self-pay

## 2020-02-25 ENCOUNTER — Encounter: Payer: Self-pay | Admitting: Podiatry

## 2020-02-25 DIAGNOSIS — L600 Ingrowing nail: Secondary | ICD-10-CM

## 2020-02-25 MED ORDER — NEOMYCIN-POLYMYXIN-HC 3.5-10000-1 OT SOLN: OTIC | 1 refills | Status: DC

## 2020-02-25 NOTE — Patient Instructions (Signed)

## 2020-02-26 NOTE — Progress Notes (Signed)
Subjective:   Patient ID: Paul Guzman, male   DOB: 29 y.o.   MRN: [id number]   HPI Patient states his toe was run over by a lawnmower and it damaged the toe itself and the nailbed and the nail has been growing into sections that are very painful and he cannot get them out and increasingly hard for him to wear shoe gear.  He is tried soaking and other modalities and patient does not smoke likes to be active   Review of Systems  All other systems reviewed and are negative.       Objective:  Physical Exam Vitals and nursing note reviewed.  Constitutional:      Appearance: He is well-developed.  Pulmonary:     Effort: Pulmonary effort is normal.  Musculoskeletal:        General: Normal range of motion.  Skin:    General: Skin is warm.  Neurological:     Mental Status: He is alert.     Neurovascular status intact muscle strength found to be adequate range of motion within normal limits.  Patient is found to have a damaged right hallux nail with 2 sections that are digging into the distal flesh creating irritation of the tissue and pain with palpation.  Patient is found to have good digital perfusion well oriented x3     Assessment:  Damaged right hallux nail with 2 sections present that are painful with palpation secondary to injury     Plan:  8 H&P reviewed condition.  I recommended removal of these nail segments and a permanent type procedure.  Hopefully this will solve her problem long-term and patient wants surgery and signed consent form.  I infiltrated the right hallux 60 mg Xylocaine Marcaine mixture sterile prep done and using sterile instrumentation remove the 2 sections exposed matrix and applied phenol for applications 30 seconds followed by alcohol lavage and sterile dressing.  Gave instructions on soaks and reappoint and is encouraged to leave dressing on 24 hours but take it off earlier if any throbbing were to occur and also drops were prescribed at this time

## 2020-04-05 DIAGNOSIS — Z23 Encounter for immunization: Secondary | ICD-10-CM | POA: Diagnosis not present

## 2020-09-07 DIAGNOSIS — J019 Acute sinusitis, unspecified: Secondary | ICD-10-CM | POA: Diagnosis not present

## 2021-02-03 DIAGNOSIS — B342 Coronavirus infection, unspecified: Secondary | ICD-10-CM | POA: Diagnosis not present

## 2021-02-07 ENCOUNTER — Ambulatory Visit (INDEPENDENT_AMBULATORY_CARE_PROVIDER_SITE_OTHER): Payer: BC Managed Care – PPO | Admitting: Family Medicine

## 2021-02-07 ENCOUNTER — Ambulatory Visit
Admission: RE | Admit: 2021-02-07 | Discharge: 2021-02-07 | Disposition: A | Discharge status: Home / Self Care | Payer: BC Managed Care – PPO | Source: Ambulatory Visit | Attending: Family Medicine | Admitting: Family Medicine

## 2021-02-07 ENCOUNTER — Other Ambulatory Visit: Payer: Self-pay

## 2021-02-07 VITALS — BP 124/82 | HR 80 | Temp 97.2°F | Ht 70.0 in | Wt 315.0 lb

## 2021-02-07 DIAGNOSIS — R0602 Shortness of breath: Secondary | ICD-10-CM

## 2021-02-07 MED ORDER — AZITHROMYCIN 250 MG PO TABS: ORAL_TABLET | ORAL | 0 refills | Status: DC

## 2021-02-07 NOTE — Progress Notes (Signed)
Subjective:    Patient ID: Paul Guzman, male    DOB: Jun 07, 1991, 30 y.o.   MRN: [id number]  HPI Patient is a 30 year old white male who was diagnosed with COVID 2 weeks ago.  He states that it took about 5 days but he almost completely recovered from COVID.  His fever dropped from 103 down to normal.  By 7 days he was feeling completely back to normal.  However he then took a turn for the worse.  He states that over the last week he has developed a low-grade fever again to 100-1 01.  He reports coughing.  He denies any pleurisy.  He denies any hemoptysis.  He denies any purulent sputum but he does report some shortness of breath with activity.  He reports tightness in his chest.  He denies any swelling in his legs.  He denies any unilateral edema. Past Medical History:  Diagnosis Date  . Migraines    No past surgical history on file. Current Outpatient Medications on File Prior to Visit  Medication Sig Dispense Refill  . benzonatate (TESSALON) 100 MG capsule Take by mouth 3 (three) times daily as needed for cough.    Marland Kitchen acetaminophen (TYLENOL) 325 MG tablet Take 650 mg by mouth daily as needed for moderate pain or fever.    Marland Kitchen albuterol (PROVENTIL HFA;VENTOLIN HFA) 108 (90 Base) MCG/ACT inhaler Inhale 1-2 puffs into the lungs every 6 (six) hours as needed for wheezing or shortness of breath. (Patient not taking: Reported on 02/07/2021) 1 Inhaler 0  . azelastine (ASTELIN) 0.1 % nasal spray PLACE 2 SPRAYS INTO BOTH NOSTRILS 2 (TWO) TIMES DAILY. USE IN EACH NOSTRIL AS DIRECTED (Patient not taking: Reported on 02/07/2021) 30 mL 1  . cephALEXin (KEFLEX) 500 MG capsule 2 caps po bid x 7 days (Patient not taking: Reported on 02/07/2021) 28 capsule 0  . HYDROcodone-acetaminophen (NORCO/VICODIN) 5-325 MG tablet Take 1 tablet by mouth every 6 (six) hours as needed for moderate pain. (Patient not taking: Reported on 02/07/2021) 10 tablet 0  . loratadine (CLARITIN) 10 MG tablet TAKE 1 TABLET BY MOUTH EVERY DAY  (Patient not taking: Reported on 02/07/2021) 90 tablet 3  . neomycin-polymyxin-hydrocortisone (CORTISPORIN) OTIC solution Apply 1-2 drops to toe after soaking BID (Patient not taking: Reported on 02/07/2021) 10 mL 1  . Pseudoephedrine HCl (SUDAFED PO) Take 2 tablets by mouth daily as needed (nasal congestion). (Patient not taking: Reported on 02/07/2021)    . rizatriptan (MAXALT) 10 MG tablet TAKE 1 TABLET BY MOUTH AS NEEDED FOR MIGRAINES (Patient not taking: Reported on 02/07/2021) 6 tablet 5   No current facility-administered medications on file prior to visit.   Allergies  Allergen Reactions  . Other     Tree Nuts    Social History   Socioeconomic History  . Marital status: Single    Spouse name: Not on file  . Number of children: 0  . Years of education: Not on file  . Highest education level: Not on file  Occupational History  . Occupation: Consulting civil engineer  Tobacco Use  . Smoking status: Never  . Smokeless tobacco: Never  Vaping Use  . Vaping Use: Never used  Substance and Sexual Activity  . Alcohol use: No  . Drug use: No  . Sexual activity: Not on file  Other Topics Concern  . Not on file  Social History Narrative  . Not on file   Social Determinants of Health   Financial Resource Strain: Not on file  Food Insecurity: Not on file  Transportation Needs: Not on file  Physical Activity: Not on file  Stress: Not on file  Social Connections: Not on file  Intimate Partner Violence: Not on file      Review of Systems  All other systems reviewed and are negative.     Objective:   Physical Exam Vitals reviewed.  Constitutional:      Appearance: Normal appearance. He is obese.  HENT:     Right Ear: Tympanic membrane and ear canal normal.     Left Ear: Tympanic membrane and ear canal normal.  Cardiovascular:     Rate and Rhythm: Tachycardia present.     Heart sounds: Normal heart sounds. No murmur heard.   No gallop.  Pulmonary:     Effort: Pulmonary effort is  normal. No respiratory distress.     Breath sounds: Decreased air movement present. No stridor. Decreased breath sounds present. No wheezing, rhonchi or rales.  Musculoskeletal:     Cervical back: Normal range of motion and neck supple.  Lymphadenopathy:     Cervical: No cervical adenopathy.  Neurological:     Mental Status: He is alert.          Assessment & Plan:  SOB (shortness of breath) - Plan: DG Chest 2 View I am very concerned about secondary pneumonia and opportunistic infection given the story.  I will start the patient on a Z-Pak to cover atypical bacteria and order a chest x-ray.  Also on the differential diagnosis would be a pulmonary embolism.  If he develops any severe shortness of breath or pleurisy or chest pain he is to recheck with me immediately.

## 2021-02-13 ENCOUNTER — Other Ambulatory Visit: Payer: Self-pay

## 2021-02-13 ENCOUNTER — Other Ambulatory Visit: Payer: BC Managed Care – PPO

## 2021-02-13 ENCOUNTER — Telehealth (INDEPENDENT_AMBULATORY_CARE_PROVIDER_SITE_OTHER): Payer: BC Managed Care – PPO | Admitting: Nurse Practitioner

## 2021-02-13 ENCOUNTER — Encounter: Payer: Self-pay | Admitting: Nurse Practitioner

## 2021-02-13 DIAGNOSIS — R059 Cough, unspecified: Secondary | ICD-10-CM

## 2021-02-13 DIAGNOSIS — R0789 Other chest pain: Secondary | ICD-10-CM

## 2021-02-13 DIAGNOSIS — R0602 Shortness of breath: Secondary | ICD-10-CM

## 2021-02-13 LAB — D-DIMER, QUANTITATIVE: D-Dimer, Quant: 2.19 mcg/mL FEU — ABNORMAL HIGH (ref <0.50)

## 2021-02-13 MED ORDER — BENZONATATE 100 MG PO CAPS: 100.0000 mg | ORAL_CAPSULE | Freq: Three times a day (TID) | ORAL | 0 refills | Status: DC | PRN

## 2021-02-13 MED ORDER — ALBUTEROL SULFATE HFA 108 (90 BASE) MCG/ACT IN AERS: 1.0000 | INHALATION_SPRAY | Freq: Four times a day (QID) | RESPIRATORY_TRACT | 0 refills | Status: AC | PRN

## 2021-02-13 MED ORDER — PREDNISONE 20 MG PO TABS: 20.0000 mg | ORAL_TABLET | Freq: Every day | ORAL | 0 refills | Status: AC

## 2021-02-13 NOTE — Progress Notes (Addendum)
Subjective:    Patient ID: Paul Guzman, male    DOB: [birth year], 30 y.o.   MRN: [id number]  HPI: Paul Guzman is a 30 y.o. male presenting virtually for ongoing shortness of breath.  Chief Complaint  Patient presents with   Shortness of Breath   SHORTNESS OF BREATH He was diagnosed with COVID ~3 weeks ago now.  Cough is better some.  Shortness of breath is not better; yesterday he cooked dinner and that took all of his energy.  Duration: weeks Onset: gradual Description of breathing discomfort: shortness of breath, can't take deep breath and can't catch breath Severity: severe Episode duration: constant Frequency: constant Related to exertion: yes Cough: no Chest tightness: yes Wheezing: no Fevers: yes; 100-101 Chest pain: no Palpitations: no  Decreased appetite: yes Nausea: no Diaphoresis: no Deconditioning: no Status: stable Aggravating factors: activity Alleviating factors: trying to slow breathing  Treatments attempted: trying to slow breathing  Has had asthma as a child and describes pressure as similar.  Has had to use albuterol every so often in adulthood.  Allergies  Allergen Reactions   Other     Tree Nuts     Outpatient Encounter Medications as of 02/13/2021  Medication Sig   predniSONE (DELTASONE) 20 MG tablet Take 1 tablet (20 mg total) by mouth daily with breakfast for 5 days.   acetaminophen (TYLENOL) 325 MG tablet Take 650 mg by mouth daily as needed for moderate pain or fever.   albuterol (VENTOLIN HFA) 108 (90 Base) MCG/ACT inhaler Inhale 1-2 puffs into the lungs every 6 (six) hours as needed for wheezing or shortness of breath.   benzonatate (TESSALON) 100 MG capsule Take 1 capsule (100 mg total) by mouth 3 (three) times daily as needed for cough.   [DISCONTINUED] albuterol (PROVENTIL HFA;VENTOLIN HFA) 108 (90 Base) MCG/ACT inhaler Inhale 1-2 puffs into the lungs every 6 (six) hours as needed for wheezing or shortness of breath. (Patient not  taking: Reported on 02/07/2021)   [DISCONTINUED] azelastine (ASTELIN) 0.1 % nasal spray PLACE 2 SPRAYS INTO BOTH NOSTRILS 2 (TWO) TIMES DAILY. USE IN EACH NOSTRIL AS DIRECTED (Patient not taking: Reported on 02/07/2021)   [DISCONTINUED] azithromycin (ZITHROMAX) 250 MG tablet 2 tabs poqday1, 1 tab poqday 2-5   [DISCONTINUED] benzonatate (TESSALON) 100 MG capsule Take by mouth 3 (three) times daily as needed for cough.   [DISCONTINUED] cephALEXin (KEFLEX) 500 MG capsule 2 caps po bid x 7 days (Patient not taking: Reported on 02/07/2021)   [DISCONTINUED] HYDROcodone-acetaminophen (NORCO/VICODIN) 5-325 MG tablet Take 1 tablet by mouth every 6 (six) hours as needed for moderate pain. (Patient not taking: Reported on 02/07/2021)   [DISCONTINUED] loratadine (CLARITIN) 10 MG tablet TAKE 1 TABLET BY MOUTH EVERY DAY (Patient not taking: Reported on 02/07/2021)   [DISCONTINUED] neomycin-polymyxin-hydrocortisone (CORTISPORIN) OTIC solution Apply 1-2 drops to toe after soaking BID (Patient not taking: Reported on 02/07/2021)   [DISCONTINUED] Pseudoephedrine HCl (SUDAFED PO) Take 2 tablets by mouth daily as needed (nasal congestion). (Patient not taking: Reported on 02/07/2021)   [DISCONTINUED] rizatriptan (MAXALT) 10 MG tablet TAKE 1 TABLET BY MOUTH AS NEEDED FOR MIGRAINES (Patient not taking: Reported on 02/07/2021)   No facility-administered encounter medications on file as of 02/13/2021.    Patient Active Problem List   Diagnosis Date Noted   Migraines     Past Medical History:  Diagnosis Date   Migraines     Relevant past medical, surgical, family and social history reviewed and updated as indicated.  Interim medical history since our last visit reviewed.  Review of Systems Per HPI unless specifically indicated above     Objective:    There were no vitals taken for this visit.  Wt Readings from Last 3 Encounters:  02/07/21 (!) 315 lb (142.9 kg)  10/11/19 275 lb (124.7 kg)  09/15/18 277 lb (125.6 kg)     Physical Exam Constitutional:      General: He is not in acute distress.    Appearance: He is well-developed. He is not toxic-appearing.  Cardiovascular:     Comments: Unable to assess via virtual visit Pulmonary:     Effort: Pulmonary effort is normal. No tachypnea or respiratory distress.     Comments: Unable to assess lung sounds via virtual visit.  Patient talking in complete sentences without accessory muscle use during telemedicine visit.  Breathing with mouth closed. Skin:    Coloration: Skin is not cyanotic or pale.  Neurological:     Mental Status: He is alert and oriented to person, place, and time.  Psychiatric:        Mood and Affect: Mood normal.        Behavior: Behavior normal.      Assessment & Plan:  1. SOB (shortness of breath) Acute.  Patient denies chest pain and swelling in his legs today including unilateral swelling.  Will obtain STAT D-dimer to rule out acute pulmonary embolism.  If D-dimer positive, will obtain STAT CT angio of chest.  Start prednisone and albuterol rescue inhaler in the meantime.    - D-dimer, quantitative; Future - predniSONE (DELTASONE) 20 MG tablet; Take 1 tablet (20 mg total) by mouth daily with breakfast for 5 days.  Dispense: 5 tablet; Refill: 0 - albuterol (VENTOLIN HFA) 108 (90 Base) MCG/ACT inhaler; Inhale 1-2 puffs into the lungs every 6 (six) hours as needed for wheezing or shortness of breath.  Dispense: 1 each; Refill: 0  2. Chest tightness  - D-dimer, quantitative; Future  3. Cough  - benzonatate (TESSALON) 100 MG capsule; Take 1 capsule (100 mg total) by mouth 3 (three) times daily as needed for cough.  Dispense: 20 capsule; Refill: 0    Follow up plan: Return if symptoms worsen or fail to improve.  Due to the catastrophic nature of the COVID-19 pandemic, this video visit was completed soley via audio and visual contact via [facility] due to the restrictions of the COVID-19 pandemic.  All issues as above were  discussed and addressed. Physical exam was done as above through visual confirmation on [facility]. If it was felt that the patient should be evaluated in the office, they were directed there. The patient verbally consented to this visit. Location of the patient: home Location of the provider: work Those involved with this call:  Provider: Cathlean Marseilles, DNP, FNP-C CMA: n/a Front Desk/Registration: Percival Spanish  Time spent on call:  11 minutes with patient face to face via video conference. More than 50% of this time was spent in counseling and coordination of care. 8 minutes total spent in review of patient's record and preparation of their chart. I verified patient identity using two factors (patient name and date of birth). Patient consents verbally to being seen via telemedicine visit today.

## 2021-02-13 NOTE — Addendum Note (Signed)
Addended by: Cathlean Marseilles A on: 02/13/2021 03:27 PM   Modules accepted: Orders

## 2021-02-14 ENCOUNTER — Ambulatory Visit
Admission: RE | Admit: 2021-02-14 | Discharge: 2021-02-14 | Disposition: A | Discharge status: Home / Self Care | Payer: BC Managed Care – PPO | Source: Ambulatory Visit | Attending: Nurse Practitioner | Admitting: Nurse Practitioner

## 2021-02-14 DIAGNOSIS — R918 Other nonspecific abnormal finding of lung field: Secondary | ICD-10-CM | POA: Diagnosis not present

## 2021-02-14 DIAGNOSIS — R0602 Shortness of breath: Secondary | ICD-10-CM | POA: Diagnosis not present

## 2021-02-14 DIAGNOSIS — R0789 Other chest pain: Secondary | ICD-10-CM

## 2021-02-14 DIAGNOSIS — K760 Fatty (change of) liver, not elsewhere classified: Secondary | ICD-10-CM | POA: Diagnosis not present

## 2021-02-14 MED ORDER — IOPAMIDOL (ISOVUE-370) INJECTION 76%
75.0000 mL | Freq: Once | INTRAVENOUS | Status: AC | PRN
  Administered 2021-02-14: 75 mL via INTRAVENOUS

## 2021-02-24 ENCOUNTER — Other Ambulatory Visit: Payer: Self-pay

## 2021-02-24 ENCOUNTER — Encounter: Payer: Self-pay | Admitting: Nurse Practitioner

## 2021-02-24 ENCOUNTER — Ambulatory Visit (INDEPENDENT_AMBULATORY_CARE_PROVIDER_SITE_OTHER): Payer: BC Managed Care – PPO | Admitting: Nurse Practitioner

## 2021-02-24 VITALS — BP 134/88 | HR 102 | Temp 98.6°F | Resp 16 | Ht 70.0 in | Wt 304.0 lb

## 2021-02-24 DIAGNOSIS — Z8616 Personal history of COVID-19: Secondary | ICD-10-CM | POA: Diagnosis not present

## 2021-02-24 DIAGNOSIS — R0600 Dyspnea, unspecified: Secondary | ICD-10-CM | POA: Diagnosis not present

## 2021-02-24 DIAGNOSIS — R0609 Other forms of dyspnea: Secondary | ICD-10-CM

## 2021-02-24 NOTE — Progress Notes (Signed)
Subjective:    Patient ID: Paul Guzman, male    DOB: 02/08/1991, 30 y.o.   MRN: [id number]  HPI: Paul Guzman is a 30 y.o. male presenting for shortness of breath follow up.  Chief Complaint  Patient presents with   Follow-up    SOB Is not fasting   SHORTNESS OF BREATH Is still taking albuterol once in the morning after he gets ready for work.  His breahting does not wake him up out of sleep.  He also has been taking albuterol regularly in the evening before bed.  He is not affected by shortness of breath during the day and it does not limit him from what he wants to do.  He does notice some dyspnea on exertion, however this is improving.  Had COVID-19 about 4 weeks ago. Duration: weeks Onset: gradual Description of breathing discomfort: chest tightness, can't take a deep breath Severity: mild to moderate Episode duration: seconds to minutes Frequency: twice daily or with activity Related to exertion: yes sometimes Cough: occasionally, some congestion left over, this is improving Chest tightness: yes, relieved with albuterol Wheezing: no Fevers: no Chest pain: no Palpitations: no  Nausea: no Diaphoresis: no Deconditioning: no Status: better Aggravating factors: activity Alleviating factors: rest, albuterol Treatments attempted: rest, albuterol  Allergies  Allergen Reactions   Other     Tree Nuts     Outpatient Encounter Medications as of 02/24/2021  Medication Sig   acetaminophen (TYLENOL) 325 MG tablet Take 650 mg by mouth daily as needed for moderate pain or fever.   albuterol (VENTOLIN HFA) 108 (90 Base) MCG/ACT inhaler Inhale 1-2 puffs into the lungs every 6 (six) hours as needed for wheezing or shortness of breath.   [DISCONTINUED] benzonatate (TESSALON) 100 MG capsule Take 1 capsule (100 mg total) by mouth 3 (three) times daily as needed for cough. (Patient not taking: Reported on 02/24/2021)   No facility-administered encounter medications on file as of  02/24/2021.    Patient Active Problem List   Diagnosis Date Noted   Migraines     Past Medical History:  Diagnosis Date   Migraines     Relevant past medical, surgical, family and social history reviewed and updated as indicated. Interim medical history since our last visit reviewed.  Review of Systems Per HPI unless specifically indicated above     Objective:    BP 134/88   Pulse (!) 102   Temp 98.6 F (37 C) (Temporal)   Resp 16   Ht 5\' 10"  (1.778 m)   Wt (!) 304 lb (137.9 kg)   SpO2 97%   BMI 43.62 kg/m   Wt Readings from Last 3 Encounters:  02/24/21 (!) 304 lb (137.9 kg)  02/07/21 (!) 315 lb (142.9 kg)  10/11/19 275 lb (124.7 kg)    Physical Exam Vitals and nursing note reviewed.  Constitutional:      General: He is not in acute distress.    Appearance: Normal appearance. He is obese. He is not toxic-appearing.  Cardiovascular:     Rate and Rhythm: Normal rate and regular rhythm.     Heart sounds: Normal heart sounds. No murmur heard. Pulmonary:     Effort: Pulmonary effort is normal. No respiratory distress.     Breath sounds: Normal breath sounds. No wheezing, rhonchi or rales.  Skin:    General: Skin is warm and dry.     Coloration: Skin is not jaundiced or pale.     Findings: No erythema.  Neurological:     Mental Status: He is alert and oriented to person, place, and time.     Motor: No weakness.     Gait: Gait normal.  Psychiatric:        Mood and Affect: Mood normal.        Behavior: Behavior normal.        Thought Content: Thought content normal.        Judgment: Judgment normal.      Assessment & Plan:  1. History of COVID-19 Diagnosed early August.  Reports overall, symptoms have improved greatly.  He has had a chest x-ray the rule out pneumonia and has been treated with prednisone and albuterol to help with inflammation. CT angio was negative for PE last week.  It has not been 2 months since he developed COVID-19, however we did discuss the  possibility of COVID-19 long haul disease that could include dyspnea.  I feel encouraged that his dyspnea is improving and suspect it should continue to.  We discussed deep breathing exercises today and trying to slowly wean back on albuterol inhaler use.  If symptoms do not continue improving over the next month or if they worsen, he should return to office.    2. Dyspnea on exertion Acute.  Secondary to COVID-19 infection.  We discussed deep breathing exercises today and trying to slowly wean back on albuterol inhaler use.  If symptoms do not continue improving over the next month or if they worsen, he should return to office.      Follow up plan: Return if symptoms worsen or fail to improve.

## 2021-03-10 DIAGNOSIS — Z0189 Encounter for other specified special examinations: Secondary | ICD-10-CM | POA: Diagnosis not present

## 2021-03-15 DIAGNOSIS — R748 Abnormal levels of other serum enzymes: Secondary | ICD-10-CM | POA: Diagnosis not present

## 2021-03-15 DIAGNOSIS — Z043 Encounter for examination and observation following other accident: Secondary | ICD-10-CM | POA: Diagnosis not present

## 2021-03-15 DIAGNOSIS — Z713 Dietary counseling and surveillance: Secondary | ICD-10-CM | POA: Diagnosis not present

## 2021-04-27 DIAGNOSIS — R0681 Apnea, not elsewhere classified: Secondary | ICD-10-CM | POA: Diagnosis not present

## 2021-04-28 DIAGNOSIS — R0681 Apnea, not elsewhere classified: Secondary | ICD-10-CM | POA: Diagnosis not present

## 2021-05-04 DIAGNOSIS — G4733 Obstructive sleep apnea (adult) (pediatric): Secondary | ICD-10-CM | POA: Diagnosis not present

## 2021-05-31 DIAGNOSIS — G4733 Obstructive sleep apnea (adult) (pediatric): Secondary | ICD-10-CM | POA: Diagnosis not present

## 2021-07-01 DIAGNOSIS — G4733 Obstructive sleep apnea (adult) (pediatric): Secondary | ICD-10-CM | POA: Diagnosis not present

## 2021-08-01 DIAGNOSIS — G4733 Obstructive sleep apnea (adult) (pediatric): Secondary | ICD-10-CM | POA: Diagnosis not present

## 2021-08-09 DIAGNOSIS — R0681 Apnea, not elsewhere classified: Secondary | ICD-10-CM | POA: Diagnosis not present

## 2021-08-29 DIAGNOSIS — G4733 Obstructive sleep apnea (adult) (pediatric): Secondary | ICD-10-CM | POA: Diagnosis not present

## 2021-10-10 DIAGNOSIS — G4733 Obstructive sleep apnea (adult) (pediatric): Secondary | ICD-10-CM | POA: Diagnosis not present

## 2021-11-08 DIAGNOSIS — H6692 Otitis media, unspecified, left ear: Secondary | ICD-10-CM | POA: Diagnosis not present

## 2021-11-08 DIAGNOSIS — J069 Acute upper respiratory infection, unspecified: Secondary | ICD-10-CM | POA: Diagnosis not present

## 2021-11-09 DIAGNOSIS — G4733 Obstructive sleep apnea (adult) (pediatric): Secondary | ICD-10-CM | POA: Diagnosis not present

## 2022-01-17 DIAGNOSIS — Z713 Dietary counseling and surveillance: Secondary | ICD-10-CM | POA: Diagnosis not present

## 2022-01-17 DIAGNOSIS — E785 Hyperlipidemia, unspecified: Secondary | ICD-10-CM | POA: Diagnosis not present

## 2022-01-17 DIAGNOSIS — Z043 Encounter for examination and observation following other accident: Secondary | ICD-10-CM | POA: Diagnosis not present

## 2022-03-16 DIAGNOSIS — Z043 Encounter for examination and observation following other accident: Secondary | ICD-10-CM | POA: Diagnosis not present

## 2022-03-16 DIAGNOSIS — Z713 Dietary counseling and surveillance: Secondary | ICD-10-CM | POA: Diagnosis not present

## 2022-03-16 DIAGNOSIS — E785 Hyperlipidemia, unspecified: Secondary | ICD-10-CM | POA: Diagnosis not present

## 2022-04-11 DIAGNOSIS — G4733 Obstructive sleep apnea (adult) (pediatric): Secondary | ICD-10-CM | POA: Diagnosis not present

## 2023-06-10 IMAGING — CR DG CHEST 2V
2 series · 2 of 2 positions shown · non-contrast
Comparison: 09/15/2018

CLINICAL DATA: 29-year-old male with shortness of breath

EXAM:
CHEST - 2 VIEW

[w chest lat]
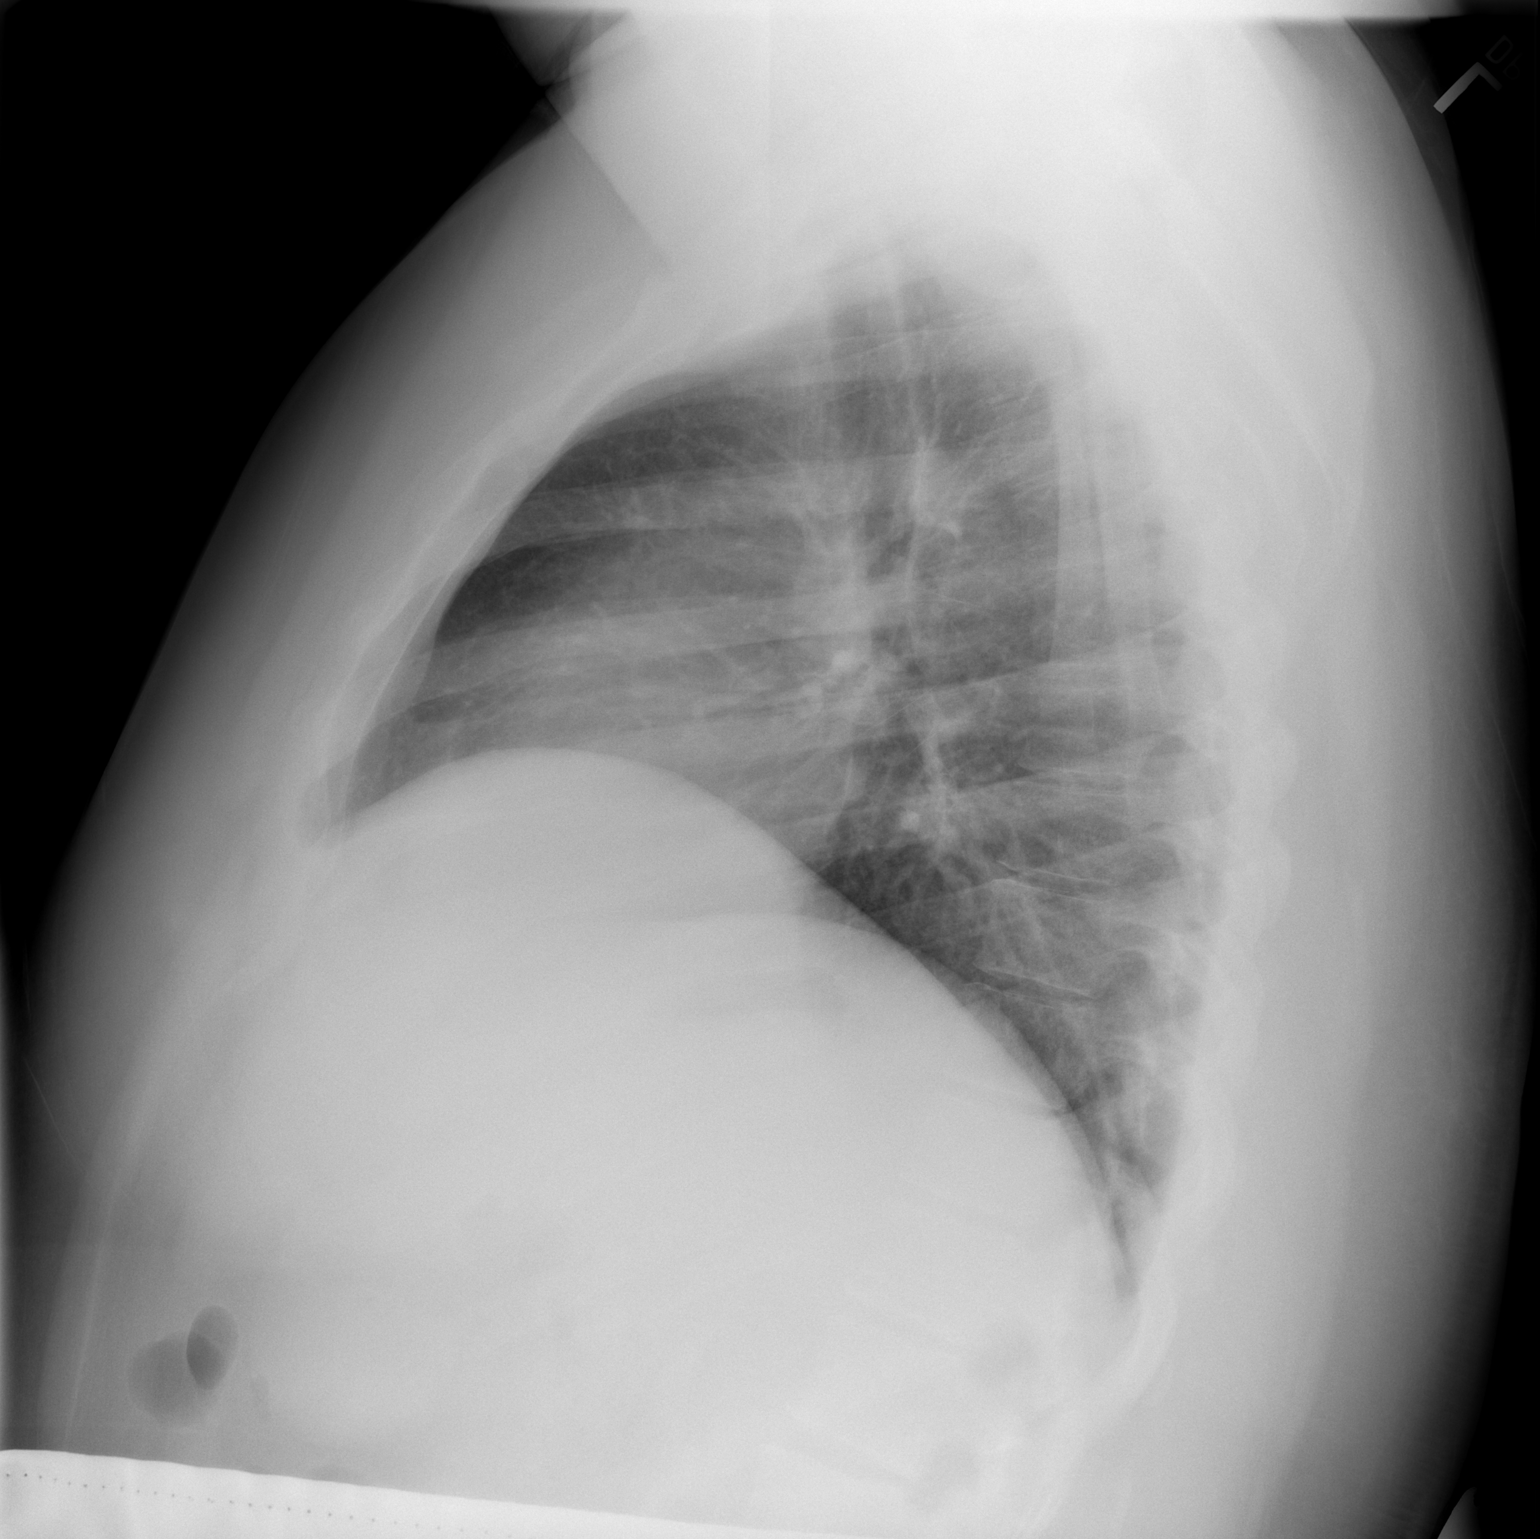

[w chest pa]
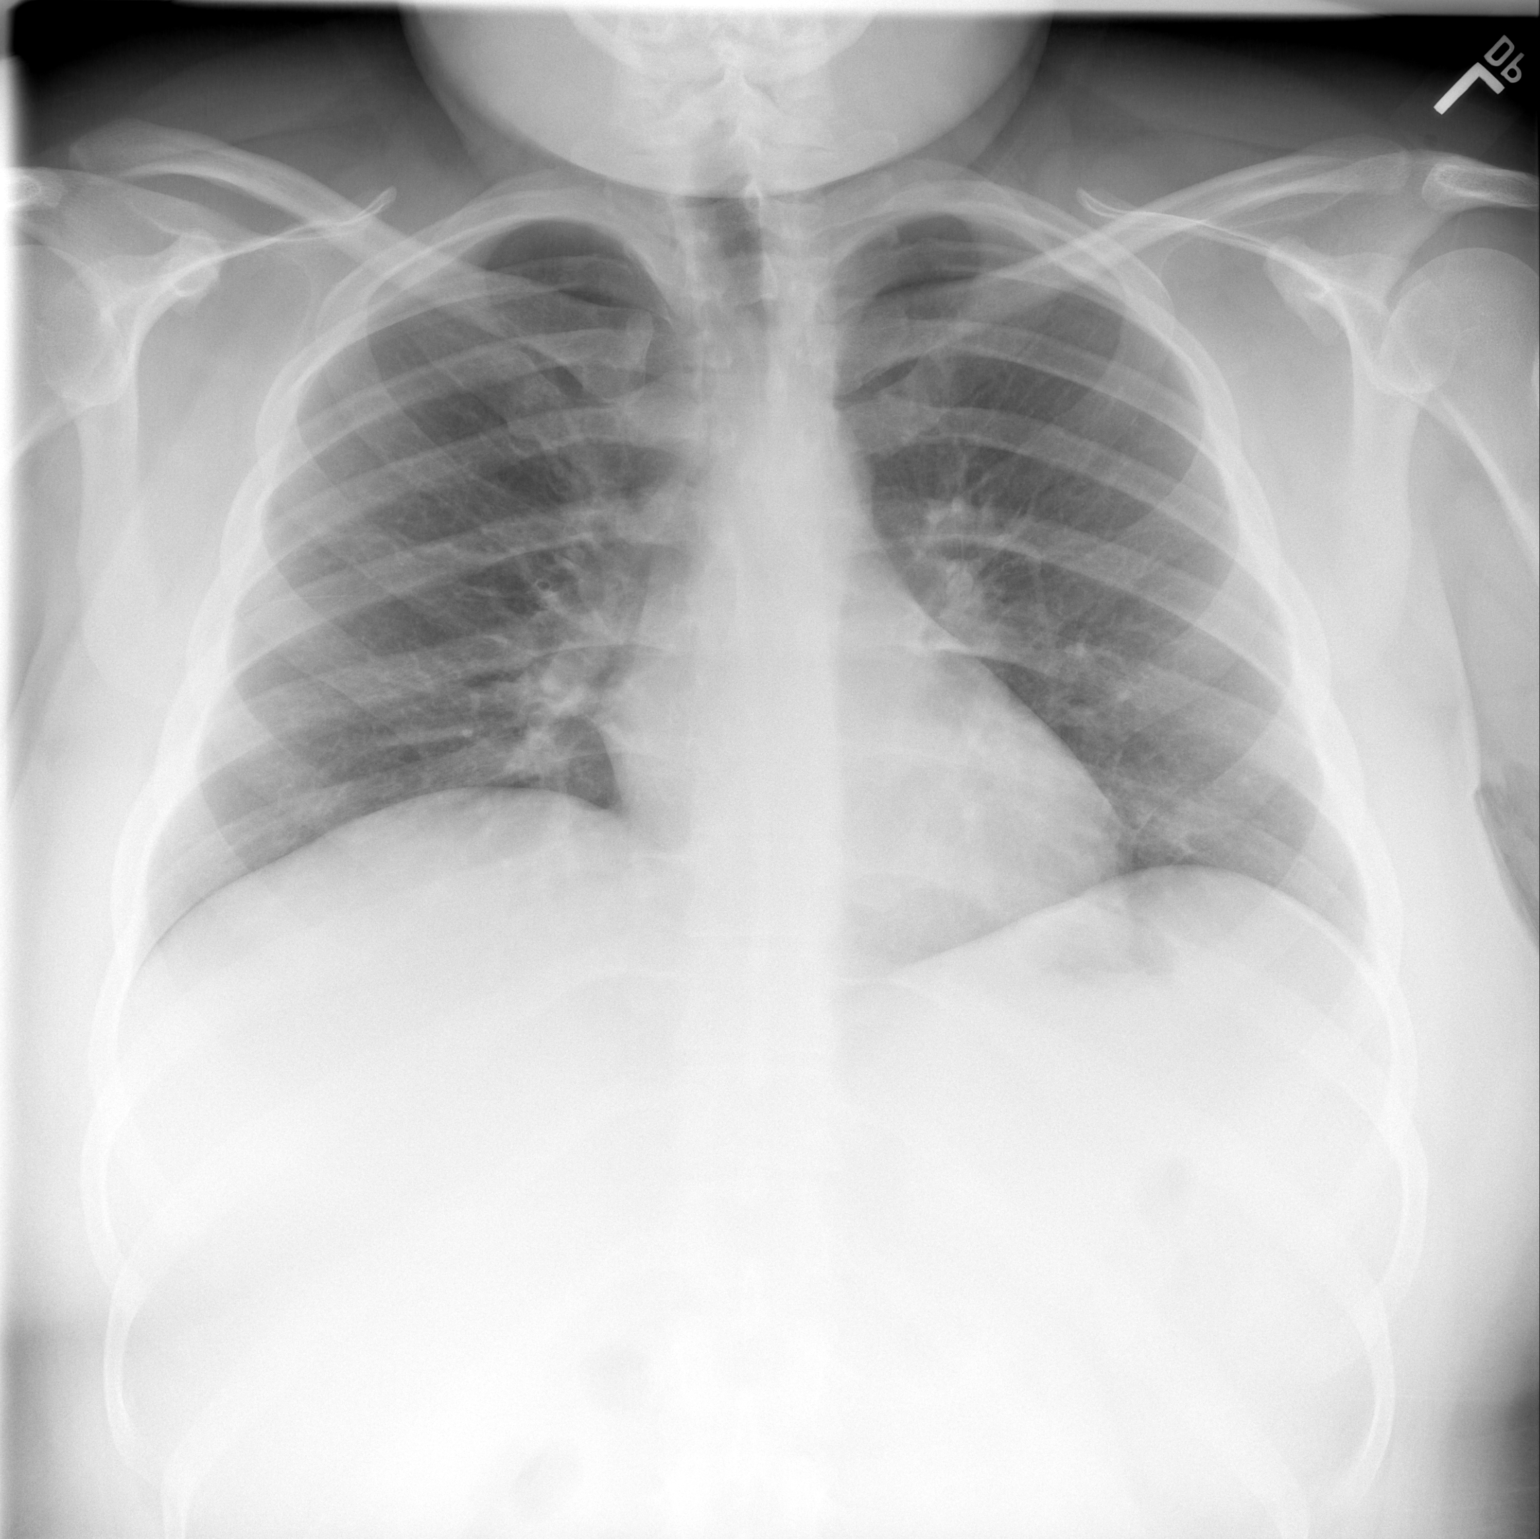

[2 of 2 positions shown; findings below may reference images not displayed]

FINDINGS: Cardiomediastinal silhouette unchanged in size and contour. No
evidence of central vascular congestion. No interlobular septal
thickening.

Similar appearance of low lung volumes

No pneumothorax or pleural effusion. Coarsened interstitial
markings, with no confluent airspace disease.

No acute displaced fracture. Degenerative changes of the spine.
IMPRESSION: Low lung volumes without evidence of acute cardiopulmonary disease

## 2023-09-14 ENCOUNTER — Ambulatory Visit
Admission: EM | Admit: 2023-09-14 | Discharge: 2023-09-14 | Disposition: A | Discharge status: Home / Self Care | Attending: Nurse Practitioner | Admitting: Nurse Practitioner

## 2023-09-14 DIAGNOSIS — Z872 Personal history of diseases of the skin and subcutaneous tissue: Secondary | ICD-10-CM | POA: Diagnosis not present

## 2023-09-14 DIAGNOSIS — L723 Sebaceous cyst: Secondary | ICD-10-CM | POA: Diagnosis not present

## 2023-09-14 DIAGNOSIS — L089 Local infection of the skin and subcutaneous tissue, unspecified: Secondary | ICD-10-CM

## 2023-09-14 MED ORDER — DOXYCYCLINE HYCLATE 100 MG PO TABS: 100.0000 mg | ORAL_TABLET | Freq: Two times a day (BID) | ORAL | 0 refills | Status: AC

## 2023-09-14 NOTE — Discharge Instructions (Signed)
 Take medication as prescribed. Warm compresses to the affected area 3-4 times daily. Clean the area at least twice daily with Dial Gold bar soap or another type of antibacterial soap. Keep the area covered while it is draining. Follow-up in this clinic if you continue to experience pain is, increased swelling, or other concerns. Go to the emergency department if you develop fever, chills, generalized fatigue, nausea, vomiting, or if the area of redness spreads. Follow-up as needed.

## 2023-09-14 NOTE — ED Triage Notes (Signed)
 Pt reports he has had  a cyst on his mid back x 3 weeks

## 2023-09-14 NOTE — ED Provider Notes (Signed)
 [facility]    CSN: [id number] Arrival date & time: 09/14/23  1018      History   Chief Complaint No chief complaint on file.   HPI Paul Guzman is a 33 y.o. male.   The history is provided by the patient.   Patient presents for complaints of a cyst to his back that is been present for the past 3 weeks.  Patient states that he has a history of cyst on his back, but that they usually go away on their own.  He states that the area on his back has drained, but states that he feels that there is still firmness around it and is also still leaking.  States that the area drain green fluid that appeared infected.  He denies fever, chills, chest pain, abdominal pain, nausea, vomiting, diarrhea, or rash.  Past Medical History:  Diagnosis Date   Migraines     Patient Active Problem List   Diagnosis Date Noted   Migraines     History reviewed. No pertinent surgical history.     Home Medications    Prior to Admission medications   Medication Sig Start Date End Date Taking? Authorizing Provider  acetaminophen (TYLENOL) 325 MG tablet Take 650 mg by mouth daily as needed for moderate pain or fever.    [provider]  albuterol (VENTOLIN HFA) 108 (90 Base) MCG/ACT inhaler Inhale 1-2 puffs into the lungs every 6 (six) hours as needed for wheezing or shortness of breath. 02/13/21   Valentino Nose, NP    Family History Family History  Problem Relation Age of Onset   Breast cancer Mother    Diabetes Maternal Grandfather    Heart disease Maternal Grandfather        CHF   Lung cancer Paternal Grandmother     Social History Social History   Tobacco Use   Smoking status: Never   Smokeless tobacco: Never  Vaping Use   Vaping status: Never Used  Substance Use Topics   Alcohol use: No   Drug use: No     Allergies   Other   Review of Systems Review of Systems Per HPI  Physical Exam Triage Vital Signs ED Triage Vitals  Encounter Vitals Group      BP 09/14/23 1028 121/81     Systolic BP Percentile --      Diastolic BP Percentile --      Pulse Rate 09/14/23 1028 90     Resp 09/14/23 1028 20     Temp 09/14/23 1028 98.7 F (37.1 C)     Temp Source 09/14/23 1028 Oral     SpO2 09/14/23 1028 97 %     Weight --      Height --      Head Circumference --      Peak Flow --      Pain Score 09/14/23 1030 0     Pain Loc --      Pain Education --      Exclude from Growth Chart --    No data found.  Updated Vital Signs BP 121/81 (BP Location: Right Arm)   Pulse 90   Temp 98.7 F (37.1 C) (Oral)   Resp 20   SpO2 97%   Visual Acuity Right Eye Distance:   Left Eye Distance:   Bilateral Distance:    Right Eye Near:   Left Eye Near:    Bilateral Near:     Physical Exam Vitals and nursing  note reviewed.  Constitutional:      General: He is not in acute distress.    Appearance: Normal appearance.  HENT:     Head: Normocephalic.  Eyes:     Extraocular Movements: Extraocular movements intact.     Pupils: Pupils are equal, round, and reactive to light.  Pulmonary:     Effort: Pulmonary effort is normal.  Musculoskeletal:     Cervical back: Normal range of motion.  Skin:    General: Skin is warm and dry.       Neurological:     General: No focal deficit present.     Mental Status: He is alert and oriented to person, place, and time.  Psychiatric:        Mood and Affect: Mood normal.        Behavior: Behavior normal.      UC Treatments / Results  Labs (all labs ordered are listed, but only abnormal results are displayed) Labs Reviewed - No data to display  EKG   Radiology No results found.  Procedures Procedures (including critical care time)  Medications Ordered in UC Medications - No data to display  Initial Impression / Assessment and Plan / UC Course  I have reviewed the triage vital signs and the nursing notes.  Pertinent labs & imaging results that were available during my care of the  patient were reviewed by me and considered in my medical decision making (see chart for details).  Patient with infected sebaceous cyst to his back.  Area is not amenable to I&D.  Will treat with doxycycline 100 mg twice daily for the next 7 days.  Supportive care recommendations were provided and discussed with the patient to include over-the-counter analgesics, warm compresses, and cleansing the area twice daily with an antibacterial soap.  Discussed indications with patient for follow-up.  Patient was in agreement with this plan of care and verbalizes understanding.  All questions were answered.  Patient stable for discharge.  Final Clinical Impressions(s) / UC Diagnoses   Final diagnoses:  None   Discharge Instructions   None    ED Prescriptions   None    PDMP not reviewed this encounter.   Abran Cantor, NP 09/14/23 1118
